# Patient Record
Sex: Male | Born: 1945 | Race: White | Hispanic: No | Marital: Married | Smoking: Never smoker
Health system: Southern US, Community
[De-identification: ages and names within clinical notes are randomized; demographics above are authoritative.]

## PROBLEM LIST (undated history)

## (undated) DIAGNOSIS — Z9289 Personal history of other medical treatment: Secondary | ICD-10-CM

## (undated) DIAGNOSIS — Q231 Congenital insufficiency of aortic valve: Secondary | ICD-10-CM

## (undated) DIAGNOSIS — IMO0002 Reserved for concepts with insufficient information to code with codable children: Secondary | ICD-10-CM

## (undated) DIAGNOSIS — T4145XA Adverse effect of unspecified anesthetic, initial encounter: Secondary | ICD-10-CM

## (undated) DIAGNOSIS — Z952 Presence of prosthetic heart valve: Secondary | ICD-10-CM

## (undated) DIAGNOSIS — T8859XA Other complications of anesthesia, initial encounter: Secondary | ICD-10-CM

## (undated) DIAGNOSIS — I2510 Atherosclerotic heart disease of native coronary artery without angina pectoris: Secondary | ICD-10-CM

## (undated) DIAGNOSIS — I10 Essential (primary) hypertension: Secondary | ICD-10-CM

## (undated) DIAGNOSIS — Z7901 Long term (current) use of anticoagulants: Secondary | ICD-10-CM

## (undated) DIAGNOSIS — C61 Malignant neoplasm of prostate: Secondary | ICD-10-CM

## (undated) DIAGNOSIS — I480 Paroxysmal atrial fibrillation: Secondary | ICD-10-CM

## (undated) DIAGNOSIS — Q2381 Bicuspid aortic valve: Secondary | ICD-10-CM

## (undated) DIAGNOSIS — IMO0001 Reserved for inherently not codable concepts without codable children: Secondary | ICD-10-CM

## (undated) DIAGNOSIS — D689 Coagulation defect, unspecified: Secondary | ICD-10-CM

## (undated) DIAGNOSIS — I499 Cardiac arrhythmia, unspecified: Secondary | ICD-10-CM

## (undated) DIAGNOSIS — I82409 Acute embolism and thrombosis of unspecified deep veins of unspecified lower extremity: Secondary | ICD-10-CM

## (undated) DIAGNOSIS — Z87442 Personal history of urinary calculi: Secondary | ICD-10-CM

## (undated) HISTORY — DX: Reserved for concepts with insufficient information to code with codable children: IMO0002

## (undated) HISTORY — DX: Essential (primary) hypertension: I10

## (undated) HISTORY — DX: Acute embolism and thrombosis of unspecified deep veins of unspecified lower extremity: I82.409

## (undated) HISTORY — PX: BACK SURGERY: SHX140

## (undated) HISTORY — DX: Paroxysmal atrial fibrillation: I48.0

## (undated) HISTORY — DX: Reserved for inherently not codable concepts without codable children: IMO0001

## (undated) HISTORY — DX: Personal history of other medical treatment: Z92.89

## (undated) HISTORY — DX: Congenital insufficiency of aortic valve: Q23.1

## (undated) HISTORY — DX: Coagulation defect, unspecified: Z79.01

## (undated) HISTORY — DX: Atherosclerotic heart disease of native coronary artery without angina pectoris: I25.10

## (undated) HISTORY — DX: Long term (current) use of anticoagulants: D68.9

## (undated) HISTORY — DX: Bicuspid aortic valve: Q23.81

---

## 2002-04-05 ENCOUNTER — Encounter: Payer: Self-pay | Admitting: Emergency Medicine

## 2002-04-05 ENCOUNTER — Inpatient Hospital Stay (HOSPITAL_COMMUNITY): Admission: EM | Admit: 2002-04-05 | Discharge: 2002-04-09 | Payer: Self-pay | Admitting: Emergency Medicine

## 2002-04-06 ENCOUNTER — Encounter: Payer: Self-pay | Admitting: Cardiology

## 2002-04-06 ENCOUNTER — Encounter: Payer: Self-pay | Admitting: Internal Medicine

## 2002-04-07 ENCOUNTER — Encounter: Payer: Self-pay | Admitting: Internal Medicine

## 2002-04-13 ENCOUNTER — Emergency Department (HOSPITAL_COMMUNITY): Admission: EM | Admit: 2002-04-13 | Discharge: 2002-04-13 | Payer: Self-pay | Admitting: Emergency Medicine

## 2002-05-01 ENCOUNTER — Ambulatory Visit (HOSPITAL_COMMUNITY): Admission: RE | Admit: 2002-05-01 | Discharge: 2002-05-01 | Payer: Self-pay | Admitting: *Deleted

## 2002-05-01 ENCOUNTER — Encounter: Payer: Self-pay | Admitting: *Deleted

## 2006-12-16 ENCOUNTER — Inpatient Hospital Stay (HOSPITAL_COMMUNITY): Admission: RE | Admit: 2006-12-16 | Discharge: 2006-12-18 | Payer: Self-pay | Admitting: Neurosurgery

## 2007-03-16 ENCOUNTER — Ambulatory Visit (HOSPITAL_COMMUNITY): Admission: RE | Admit: 2007-03-16 | Discharge: 2007-03-16 | Payer: Self-pay | Admitting: Neurosurgery

## 2008-08-19 ENCOUNTER — Encounter: Admission: RE | Admit: 2008-08-19 | Discharge: 2008-08-19 | Payer: Self-pay | Admitting: Neurosurgery

## 2008-08-23 ENCOUNTER — Encounter: Admission: RE | Admit: 2008-08-23 | Discharge: 2008-08-23 | Payer: Self-pay | Admitting: Neurosurgery

## 2008-09-10 ENCOUNTER — Ambulatory Visit: Payer: Self-pay | Admitting: Emergency Medicine

## 2008-09-10 ENCOUNTER — Inpatient Hospital Stay (HOSPITAL_COMMUNITY): Admission: RE | Admit: 2008-09-10 | Discharge: 2008-09-26 | Payer: Self-pay | Admitting: Neurosurgery

## 2008-09-18 ENCOUNTER — Encounter: Payer: Self-pay | Admitting: Emergency Medicine

## 2008-09-18 ENCOUNTER — Ambulatory Visit: Payer: Self-pay | Admitting: Vascular Surgery

## 2008-09-23 ENCOUNTER — Encounter (INDEPENDENT_AMBULATORY_CARE_PROVIDER_SITE_OTHER): Payer: Self-pay | Admitting: Neurosurgery

## 2008-09-25 ENCOUNTER — Encounter (INDEPENDENT_AMBULATORY_CARE_PROVIDER_SITE_OTHER): Payer: Self-pay | Admitting: Internal Medicine

## 2008-09-25 ENCOUNTER — Ambulatory Visit: Payer: Self-pay | Admitting: Physical Medicine & Rehabilitation

## 2008-09-26 ENCOUNTER — Inpatient Hospital Stay (HOSPITAL_COMMUNITY)
Admission: RE | Admit: 2008-09-26 | Discharge: 2008-10-01 | Payer: Self-pay | Admitting: Physical Medicine & Rehabilitation

## 2010-04-06 IMAGING — RF DG THORACOLUMBAR SPINE 2V
1 series · 5 of 5 positions shown · non-contrast
Comparison: None available.

CLINICAL DATA: T10-S1 decompression.

THORACOLUMBAR SPINE - 2 VIEW

[Series 1: run · 5 of 5 slices shown]
[im 1/5]
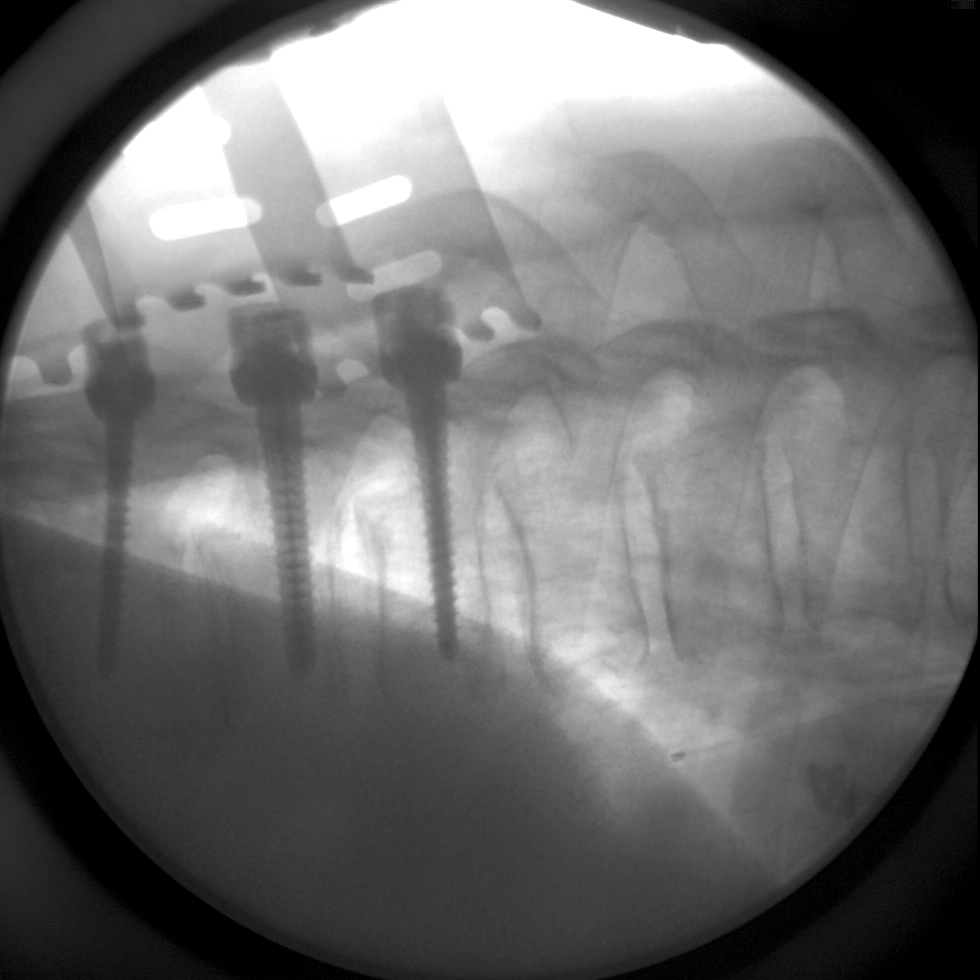
[im 2/5]
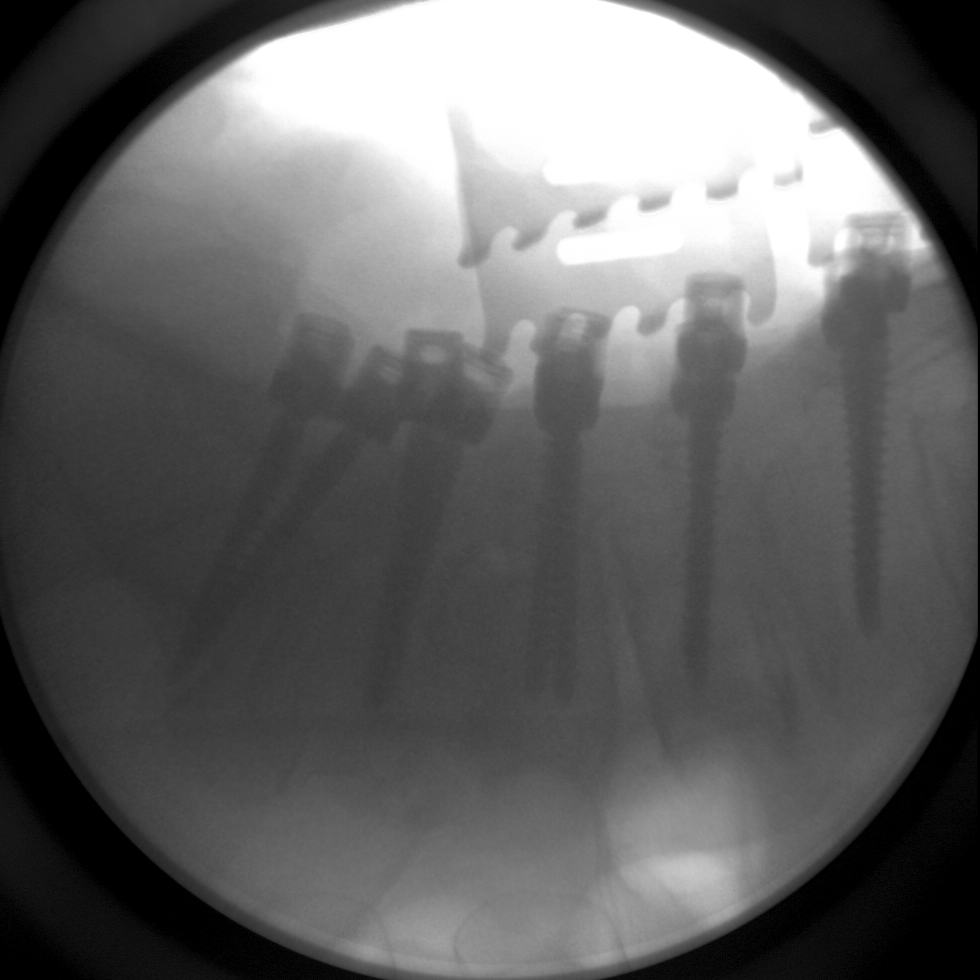
[im 3/5]
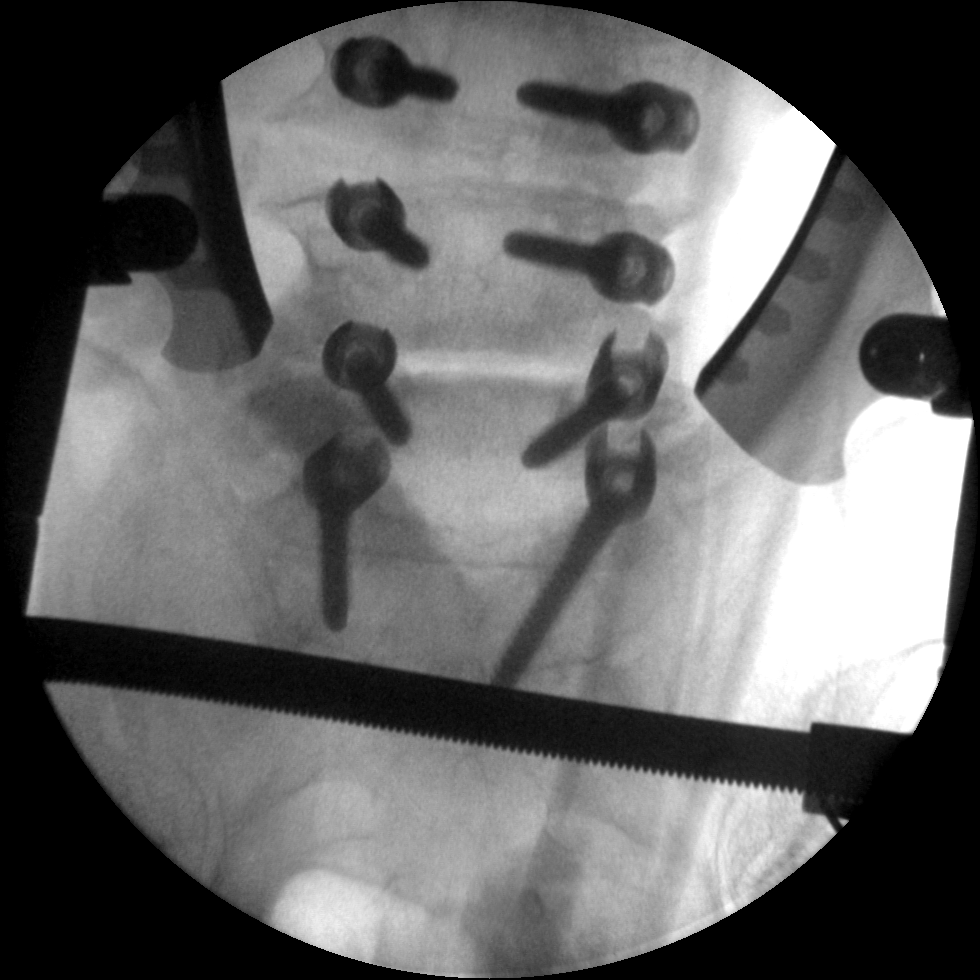
[im 4/5]
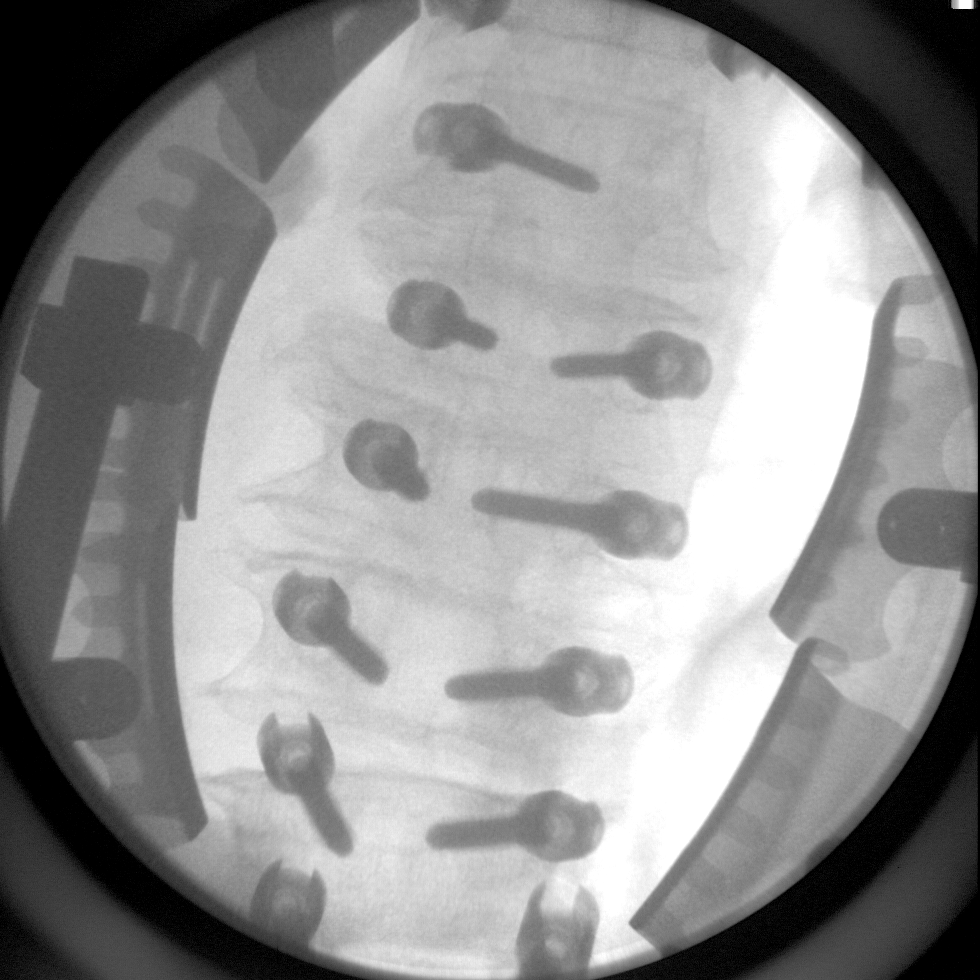
[im 5/5]
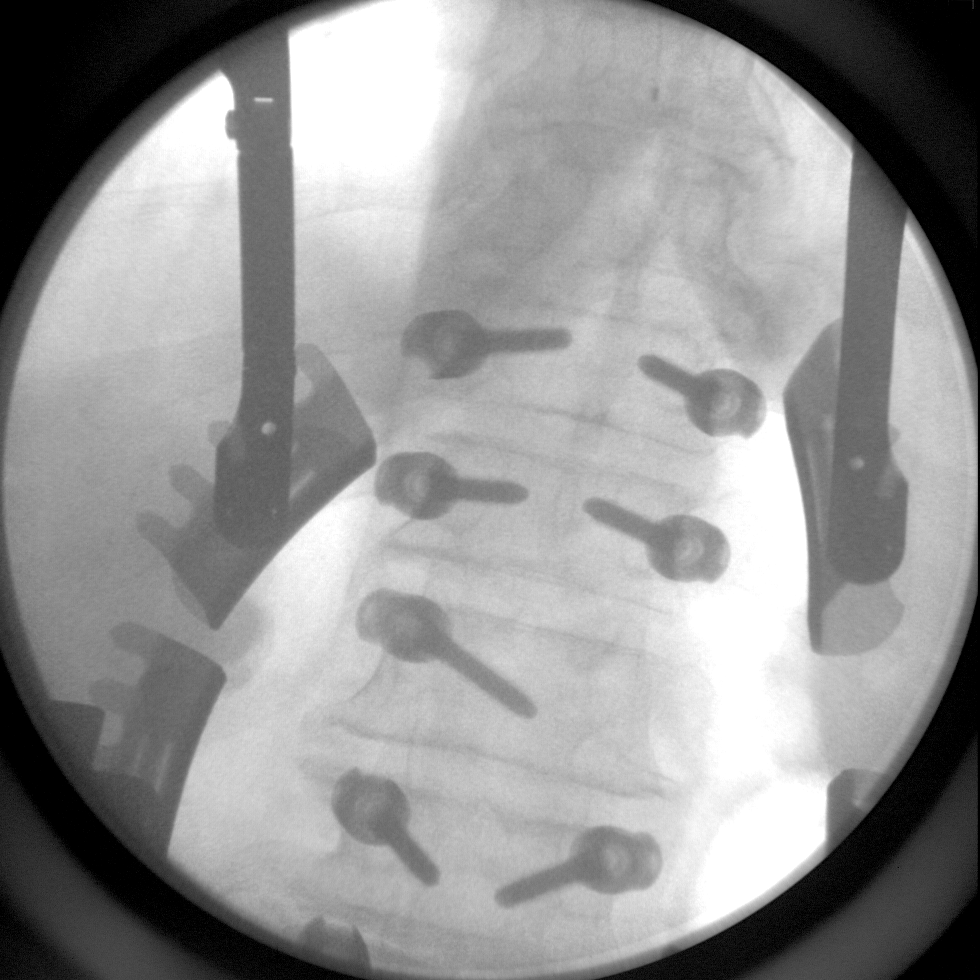

[5 of 5 positions shown; findings below may reference images not displayed]

FINDINGS: We are provided with five fluoroscopic intraoperative
spot views of the lower lumbar spine.  Images demonstrate placement
of pedicle screws from T10-S1.  Note is made that only a screw on
the left side is identified at T12.
IMPRESSION: T10-S1 PLIF in progress.

## 2010-04-08 IMAGING — CR DG ABD PORTABLE 1V
1 series · 1 of 1 positions shown · non-contrast
Comparison: Intraoperative film 09/10/2008

CLINICAL DATA: Recent lumbar surgery.

ABDOMEN - 1 VIEW

[view not recorded]
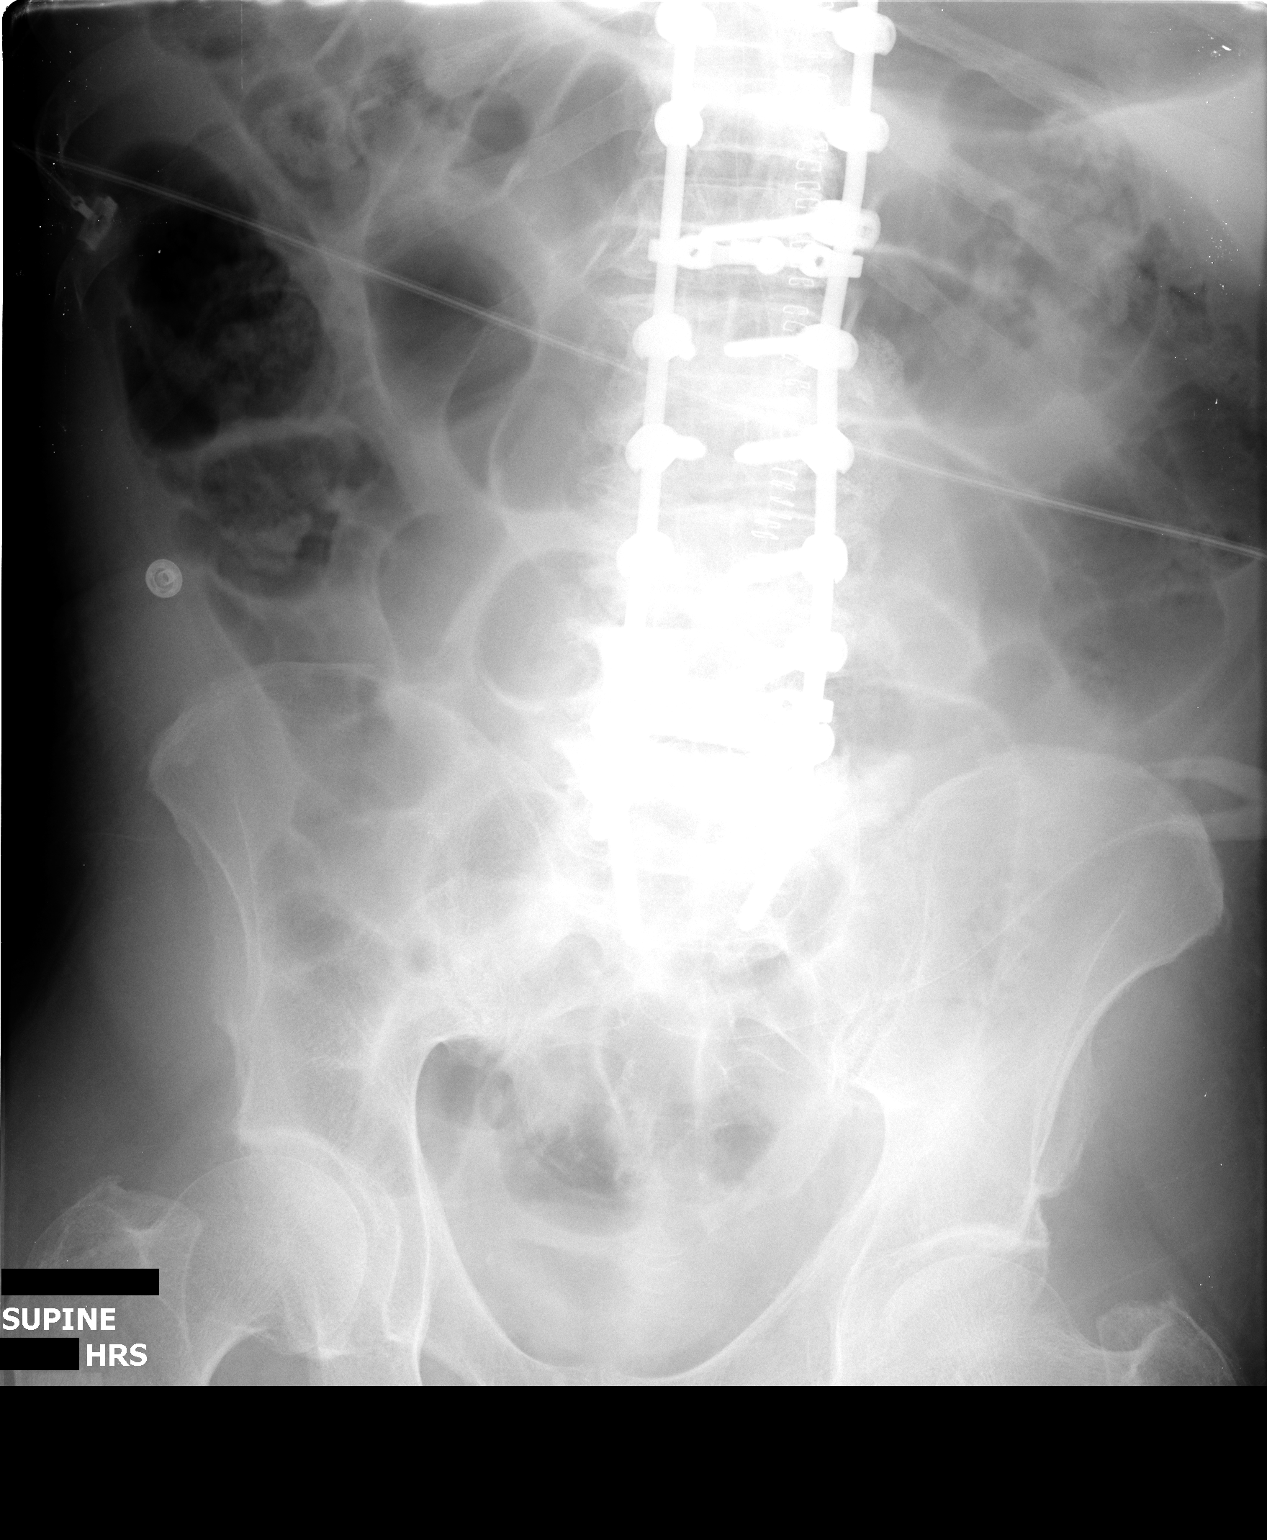

[1 of 1 positions shown; findings below may reference images not displayed]

FINDINGS: There is posterior fusion noted with pedicle screws and
posterior fusion rods and graft material  There is gas filled loops
of large and small bowel without evidence of bowel dilatation.
There is gas in the rectosigmoid colon.
IMPRESSION: 1.  Gas filled bowel without evidence of obstruction.
2.  Posterior lumbar fusion.

## 2010-04-11 IMAGING — CR DG CHEST 1V PORT
1 series · 1 of 1 positions shown · non-contrast
Comparison: 09/04/2008.

CLINICAL DATA: Ileus.  Respiratory distress.  Lumbar stenosis.

PORTABLE CHEST - 1 VIEW

[view not recorded]
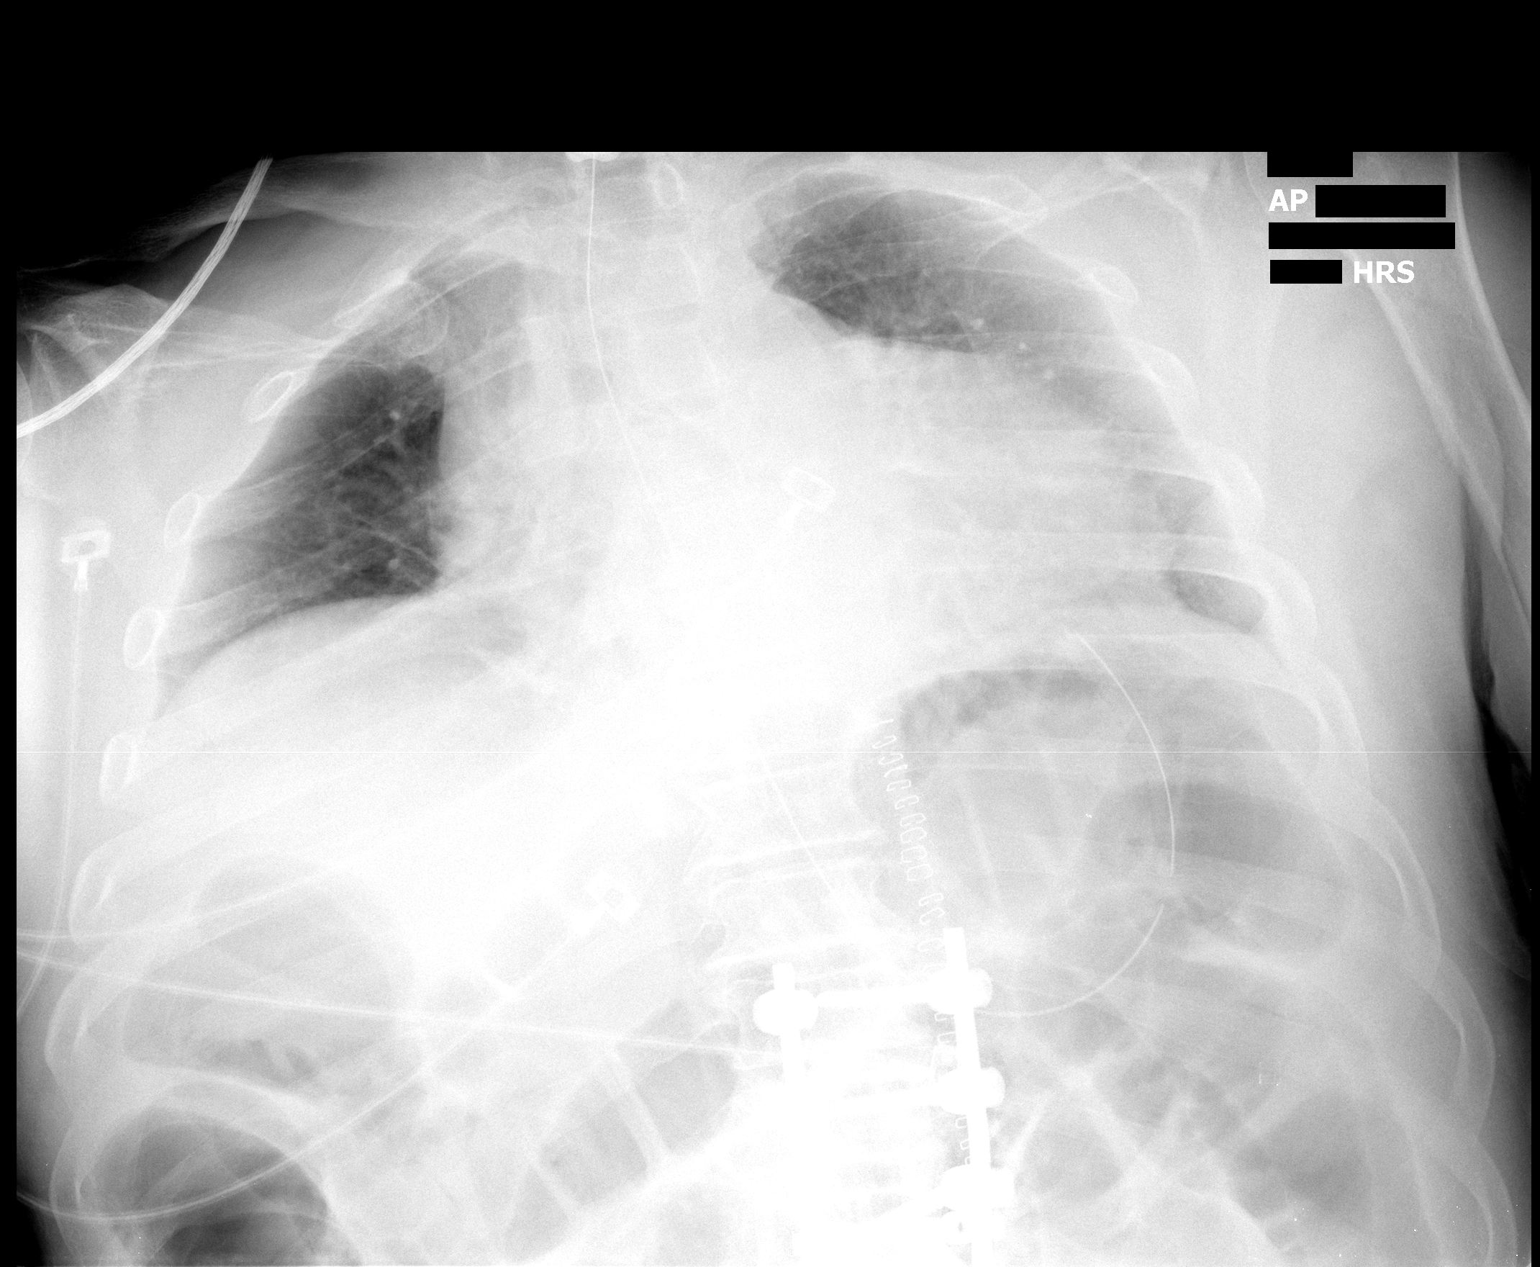

[1 of 1 positions shown; findings below may reference images not displayed]

FINDINGS: This exam of shows a markedly apical lordotic projection.
Nasogastric tube is present within the stomach.  Gaseous distention
of large and small bowel is present.  Basilar atelectasis is
present.  Lung volumes are low.  No airspace disease. Cervical ACDF
and lumbar posterior lumbar interbody fusion.
IMPRESSION: 1.  Low volume chest.
2.  Gaseous distention of bowel.
3.  Nasogastric tube within stomach.

## 2010-04-14 IMAGING — CR DG ABD PORTABLE 1V
1 series · 1 of 1 positions shown · non-contrast
Comparison: 09/17/2008

CLINICAL DATA: Ileus

ABDOMEN - 1 VIEW

[view not recorded]
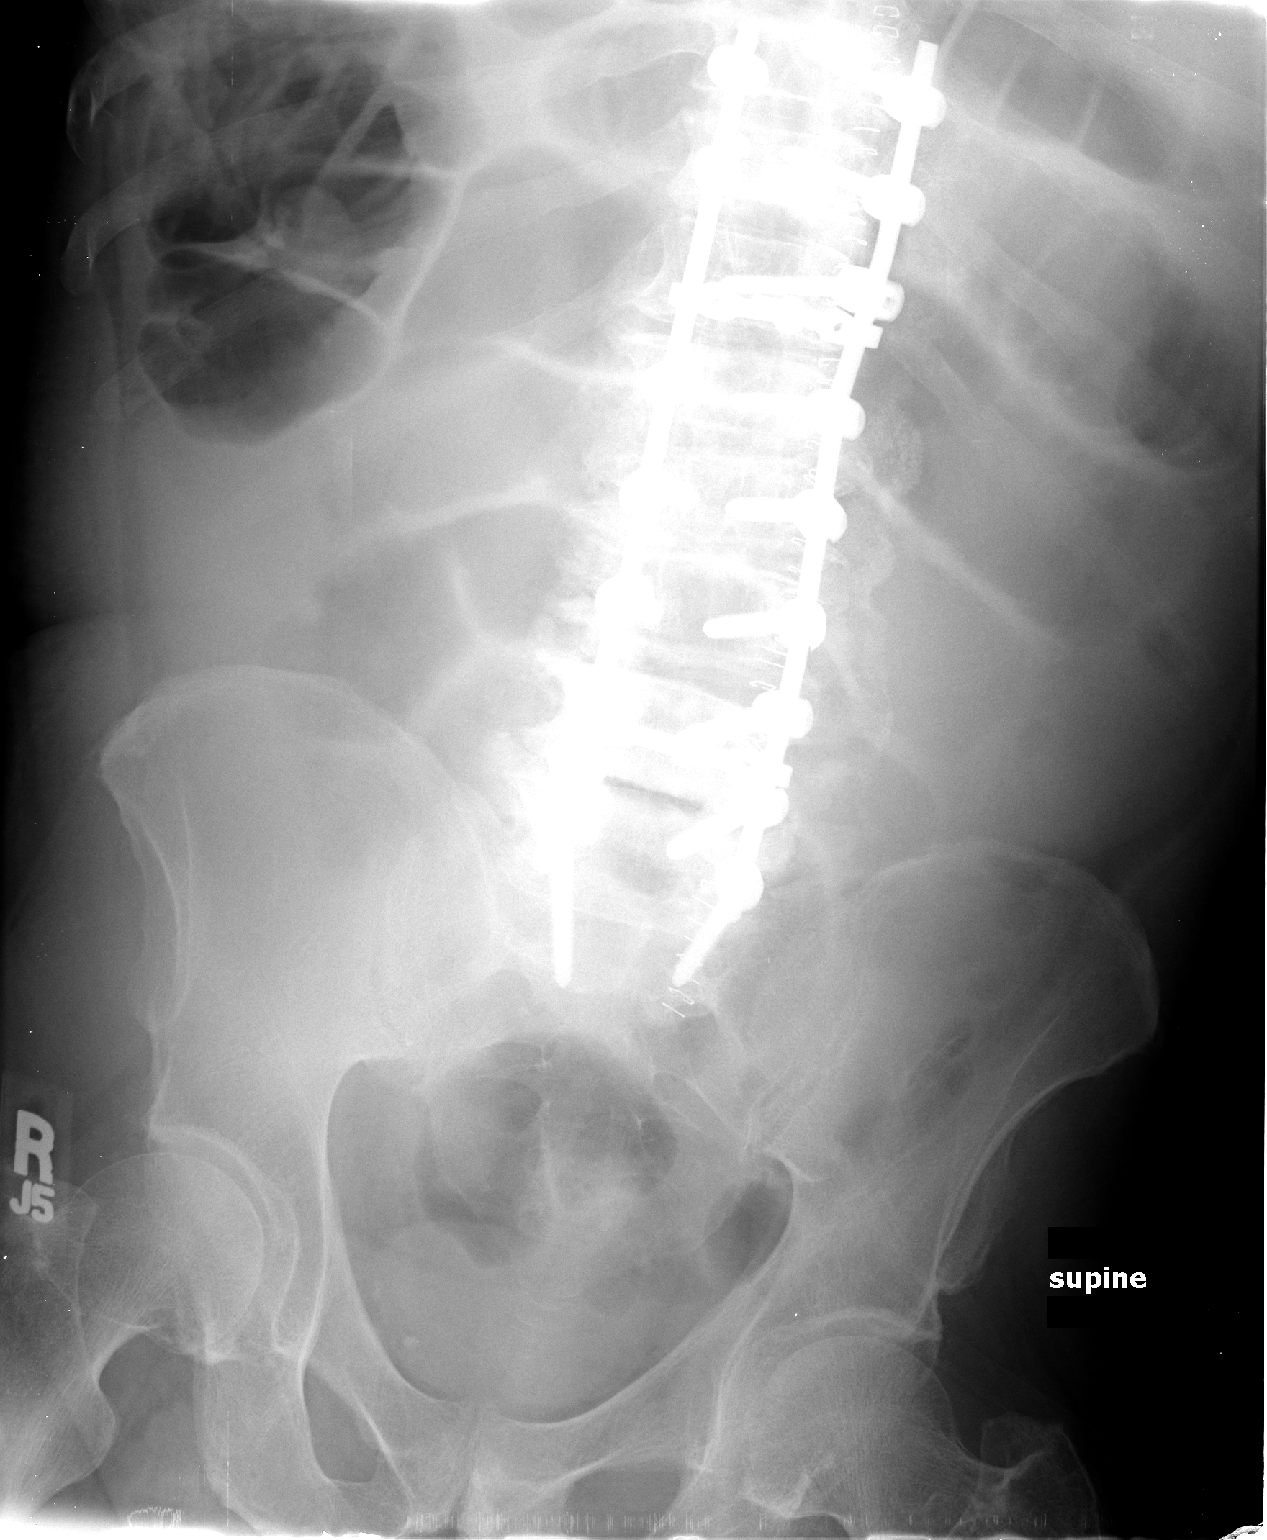

[1 of 1 positions shown; findings below may reference images not displayed]

FINDINGS: Distended small and large bowel loops persist but are
improved compatible with improving but persistent ileus.  No
obvious free intraperitoneal gas.  Spinal stabilization hardware is
unchanged.
IMPRESSION: Improving ileus.

## 2010-04-14 IMAGING — CR DG CHEST 1V PORT
1 series · 1 of 1 positions shown · non-contrast
Comparison: 09/15/2008

CLINICAL DATA: Lumbar stenosis.  Respiratory distress.

PORTABLE CHEST - 1 VIEW

[view not recorded]
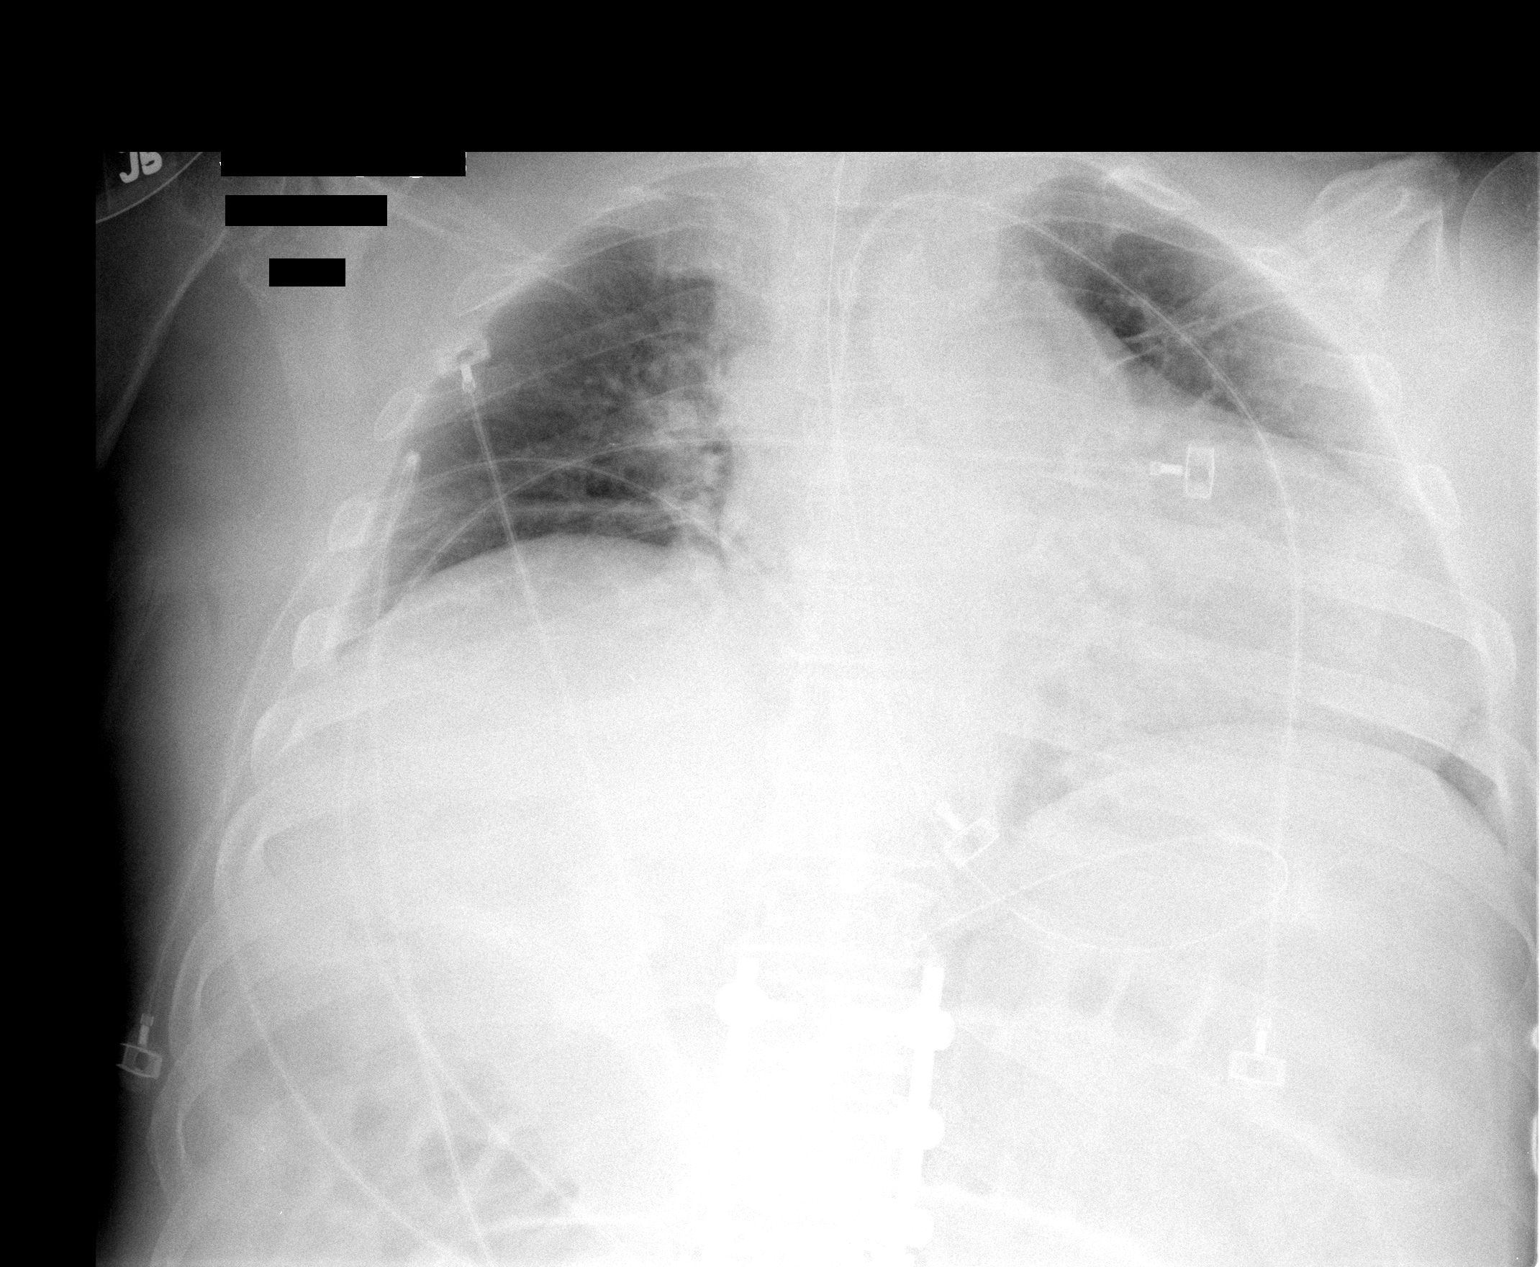

[1 of 1 positions shown; findings below may reference images not displayed]

FINDINGS: Prior cervical spine fixation.  Upper lumbar spine
fixation. Mildly degraded exam due to AP portable technique and
patient body habitus.  apical lordotic positioning. Midline
trachea. Cardiomegaly accentuated by AP portable technique.  No
definite pleural effusion. No pneumothorax.  Extremely low lung
volumes.  No definite airspace disease.  Moderate right
hemidiaphragm elevation.  Improved persistent gaseous distention of
bowel.
IMPRESSION: 1. Decreased sensitivity and specificity exam due to technique
related factors, as described above.
2.  Cardiomegaly and extremely low lung volumes.
3.  Improved bowel distention, incompletely imaged.

## 2010-08-18 LAB — BLOOD GAS, ARTERIAL
Acid-Base Excess: 4.7 mmol/L — ABNORMAL HIGH (ref 0.0–2.0)
Bicarbonate: 28.6 mEq/L — ABNORMAL HIGH (ref 20.0–24.0)
Drawn by: 249101
O2 Content: 3 L/min
O2 Saturation: 97.8 %
Patient temperature: 99.4
TCO2: 29.9 mmol/L (ref 0–100)
pCO2 arterial: 42.4 mmHg (ref 35.0–45.0)
pH, Arterial: 7.446 (ref 7.350–7.450)
pO2, Arterial: 91.8 mmHg (ref 80.0–100.0)

## 2010-08-18 LAB — COMPREHENSIVE METABOLIC PANEL
ALT: 27 U/L (ref 0–53)
ALT: 33 U/L (ref 0–53)
ALT: 33 U/L (ref 0–53)
AST: 18 U/L (ref 0–37)
AST: 18 U/L (ref 0–37)
AST: 26 U/L (ref 0–37)
Albumin: 2.1 g/dL — ABNORMAL LOW (ref 3.5–5.2)
Albumin: 2.1 g/dL — ABNORMAL LOW (ref 3.5–5.2)
Albumin: 2.5 g/dL — ABNORMAL LOW (ref 3.5–5.2)
Alkaline Phosphatase: 106 U/L (ref 39–117)
Alkaline Phosphatase: 73 U/L (ref 39–117)
Alkaline Phosphatase: 79 U/L (ref 39–117)
BUN: 6 mg/dL (ref 6–23)
BUN: 6 mg/dL (ref 6–23)
BUN: 6 mg/dL (ref 6–23)
CO2: 28 mEq/L (ref 19–32)
CO2: 28 mEq/L (ref 19–32)
CO2: 31 mEq/L (ref 19–32)
Calcium: 8.6 mg/dL (ref 8.4–10.5)
Calcium: 8.7 mg/dL (ref 8.4–10.5)
Calcium: 8.9 mg/dL (ref 8.4–10.5)
Chloride: 101 mEq/L (ref 96–112)
Chloride: 103 mEq/L (ref 96–112)
Chloride: 106 mEq/L (ref 96–112)
Creatinine, Ser: 0.74 mg/dL (ref 0.4–1.5)
Creatinine, Ser: 0.77 mg/dL (ref 0.4–1.5)
Creatinine, Ser: 0.79 mg/dL (ref 0.4–1.5)
GFR calc Af Amer: >60 mL/min (ref >60)
GFR calc Af Amer: >60 mL/min (ref >60)
GFR calc Af Amer: >60 mL/min (ref >60)
GFR calc non Af Amer: >60 mL/min (ref >60)
GFR calc non Af Amer: >60 mL/min (ref >60)
GFR calc non Af Amer: >60 mL/min (ref >60)
Glucose, Bld: 111 mg/dL — ABNORMAL HIGH (ref 70–99)
Glucose, Bld: 113 mg/dL — ABNORMAL HIGH (ref 70–99)
Glucose, Bld: 118 mg/dL — ABNORMAL HIGH (ref 70–99)
Potassium: 3.4 mEq/L — ABNORMAL LOW (ref 3.5–5.1)
Potassium: 3.5 mEq/L (ref 3.5–5.1)
Potassium: 3.5 mEq/L (ref 3.5–5.1)
Sodium: 139 mEq/L (ref 135–145)
Sodium: 139 mEq/L (ref 135–145)
Sodium: 141 mEq/L (ref 135–145)
Total Bilirubin: 0.9 mg/dL (ref 0.3–1.2)
Total Bilirubin: 1 mg/dL (ref 0.3–1.2)
Total Bilirubin: 1.1 mg/dL (ref 0.3–1.2)
Total Protein: 4.9 g/dL — ABNORMAL LOW (ref 6.0–8.3)
Total Protein: 5 g/dL — ABNORMAL LOW (ref 6.0–8.3)
Total Protein: 5.6 g/dL — ABNORMAL LOW (ref 6.0–8.3)

## 2010-08-18 LAB — GLUCOSE, CAPILLARY
Glucose-Capillary: 100 mg/dL — ABNORMAL HIGH (ref 70–99)
Glucose-Capillary: 103 mg/dL — ABNORMAL HIGH (ref 70–99)
Glucose-Capillary: 104 mg/dL — ABNORMAL HIGH (ref 70–99)
Glucose-Capillary: 108 mg/dL — ABNORMAL HIGH (ref 70–99)
Glucose-Capillary: 110 mg/dL — ABNORMAL HIGH (ref 70–99)
Glucose-Capillary: 110 mg/dL — ABNORMAL HIGH (ref 70–99)
Glucose-Capillary: 110 mg/dL — ABNORMAL HIGH (ref 70–99)
Glucose-Capillary: 110 mg/dL — ABNORMAL HIGH (ref 70–99)
Glucose-Capillary: 112 mg/dL — ABNORMAL HIGH (ref 70–99)
Glucose-Capillary: 113 mg/dL — ABNORMAL HIGH (ref 70–99)
Glucose-Capillary: 113 mg/dL — ABNORMAL HIGH (ref 70–99)
Glucose-Capillary: 113 mg/dL — ABNORMAL HIGH (ref 70–99)
Glucose-Capillary: 114 mg/dL — ABNORMAL HIGH (ref 70–99)
Glucose-Capillary: 114 mg/dL — ABNORMAL HIGH (ref 70–99)
Glucose-Capillary: 115 mg/dL — ABNORMAL HIGH (ref 70–99)
Glucose-Capillary: 115 mg/dL — ABNORMAL HIGH (ref 70–99)
Glucose-Capillary: 116 mg/dL — ABNORMAL HIGH (ref 70–99)
Glucose-Capillary: 116 mg/dL — ABNORMAL HIGH (ref 70–99)
Glucose-Capillary: 117 mg/dL — ABNORMAL HIGH (ref 70–99)
Glucose-Capillary: 119 mg/dL — ABNORMAL HIGH (ref 70–99)
Glucose-Capillary: 119 mg/dL — ABNORMAL HIGH (ref 70–99)
Glucose-Capillary: 119 mg/dL — ABNORMAL HIGH (ref 70–99)
Glucose-Capillary: 120 mg/dL — ABNORMAL HIGH (ref 70–99)
Glucose-Capillary: 123 mg/dL — ABNORMAL HIGH (ref 70–99)
Glucose-Capillary: 124 mg/dL — ABNORMAL HIGH (ref 70–99)
Glucose-Capillary: 124 mg/dL — ABNORMAL HIGH (ref 70–99)
Glucose-Capillary: 125 mg/dL — ABNORMAL HIGH (ref 70–99)
Glucose-Capillary: 125 mg/dL — ABNORMAL HIGH (ref 70–99)
Glucose-Capillary: 126 mg/dL — ABNORMAL HIGH (ref 70–99)
Glucose-Capillary: 131 mg/dL — ABNORMAL HIGH (ref 70–99)
Glucose-Capillary: 131 mg/dL — ABNORMAL HIGH (ref 70–99)
Glucose-Capillary: 132 mg/dL — ABNORMAL HIGH (ref 70–99)
Glucose-Capillary: 134 mg/dL — ABNORMAL HIGH (ref 70–99)
Glucose-Capillary: 137 mg/dL — ABNORMAL HIGH (ref 70–99)
Glucose-Capillary: 140 mg/dL — ABNORMAL HIGH (ref 70–99)
Glucose-Capillary: 141 mg/dL — ABNORMAL HIGH (ref 70–99)
Glucose-Capillary: 144 mg/dL — ABNORMAL HIGH (ref 70–99)
Glucose-Capillary: 144 mg/dL — ABNORMAL HIGH (ref 70–99)
Glucose-Capillary: 149 mg/dL — ABNORMAL HIGH (ref 70–99)
Glucose-Capillary: 152 mg/dL — ABNORMAL HIGH (ref 70–99)
Glucose-Capillary: 153 mg/dL — ABNORMAL HIGH (ref 70–99)
Glucose-Capillary: 72 mg/dL (ref 70–99)
Glucose-Capillary: 93 mg/dL (ref 70–99)
Glucose-Capillary: 95 mg/dL (ref 70–99)
Glucose-Capillary: 99 mg/dL (ref 70–99)
Glucose-Capillary: 103 mg/dL — ABNORMAL HIGH (ref 70–99)
Glucose-Capillary: 106 mg/dL — ABNORMAL HIGH (ref 70–99)
Glucose-Capillary: 110 mg/dL — ABNORMAL HIGH (ref 70–99)
Glucose-Capillary: 111 mg/dL — ABNORMAL HIGH (ref 70–99)
Glucose-Capillary: 112 mg/dL — ABNORMAL HIGH (ref 70–99)
Glucose-Capillary: 112 mg/dL — ABNORMAL HIGH (ref 70–99)
Glucose-Capillary: 113 mg/dL — ABNORMAL HIGH (ref 70–99)
Glucose-Capillary: 114 mg/dL — ABNORMAL HIGH (ref 70–99)
Glucose-Capillary: 114 mg/dL — ABNORMAL HIGH (ref 70–99)
Glucose-Capillary: 117 mg/dL — ABNORMAL HIGH (ref 70–99)
Glucose-Capillary: 117 mg/dL — ABNORMAL HIGH (ref 70–99)
Glucose-Capillary: 117 mg/dL — ABNORMAL HIGH (ref 70–99)
Glucose-Capillary: 118 mg/dL — ABNORMAL HIGH (ref 70–99)
Glucose-Capillary: 119 mg/dL — ABNORMAL HIGH (ref 70–99)
Glucose-Capillary: 120 mg/dL — ABNORMAL HIGH (ref 70–99)
Glucose-Capillary: 120 mg/dL — ABNORMAL HIGH (ref 70–99)
Glucose-Capillary: 121 mg/dL — ABNORMAL HIGH (ref 70–99)
Glucose-Capillary: 122 mg/dL — ABNORMAL HIGH (ref 70–99)
Glucose-Capillary: 123 mg/dL — ABNORMAL HIGH (ref 70–99)
Glucose-Capillary: 123 mg/dL — ABNORMAL HIGH (ref 70–99)
Glucose-Capillary: 123 mg/dL — ABNORMAL HIGH (ref 70–99)
Glucose-Capillary: 123 mg/dL — ABNORMAL HIGH (ref 70–99)
Glucose-Capillary: 128 mg/dL — ABNORMAL HIGH (ref 70–99)
Glucose-Capillary: 128 mg/dL — ABNORMAL HIGH (ref 70–99)
Glucose-Capillary: 130 mg/dL — ABNORMAL HIGH (ref 70–99)
Glucose-Capillary: 132 mg/dL — ABNORMAL HIGH (ref 70–99)
Glucose-Capillary: 133 mg/dL — ABNORMAL HIGH (ref 70–99)
Glucose-Capillary: 136 mg/dL — ABNORMAL HIGH (ref 70–99)
Glucose-Capillary: 136 mg/dL — ABNORMAL HIGH (ref 70–99)
Glucose-Capillary: 138 mg/dL — ABNORMAL HIGH (ref 70–99)
Glucose-Capillary: 139 mg/dL — ABNORMAL HIGH (ref 70–99)
Glucose-Capillary: 139 mg/dL — ABNORMAL HIGH (ref 70–99)
Glucose-Capillary: 144 mg/dL — ABNORMAL HIGH (ref 70–99)
Glucose-Capillary: 145 mg/dL — ABNORMAL HIGH (ref 70–99)
Glucose-Capillary: 145 mg/dL — ABNORMAL HIGH (ref 70–99)
Glucose-Capillary: 145 mg/dL — ABNORMAL HIGH (ref 70–99)
Glucose-Capillary: 145 mg/dL — ABNORMAL HIGH (ref 70–99)
Glucose-Capillary: 145 mg/dL — ABNORMAL HIGH (ref 70–99)
Glucose-Capillary: 146 mg/dL — ABNORMAL HIGH (ref 70–99)
Glucose-Capillary: 146 mg/dL — ABNORMAL HIGH (ref 70–99)
Glucose-Capillary: 147 mg/dL — ABNORMAL HIGH (ref 70–99)
Glucose-Capillary: 148 mg/dL — ABNORMAL HIGH (ref 70–99)
Glucose-Capillary: 148 mg/dL — ABNORMAL HIGH (ref 70–99)
Glucose-Capillary: 148 mg/dL — ABNORMAL HIGH (ref 70–99)
Glucose-Capillary: 149 mg/dL — ABNORMAL HIGH (ref 70–99)
Glucose-Capillary: 149 mg/dL — ABNORMAL HIGH (ref 70–99)
Glucose-Capillary: 150 mg/dL — ABNORMAL HIGH (ref 70–99)
Glucose-Capillary: 150 mg/dL — ABNORMAL HIGH (ref 70–99)
Glucose-Capillary: 151 mg/dL — ABNORMAL HIGH (ref 70–99)
Glucose-Capillary: 152 mg/dL — ABNORMAL HIGH (ref 70–99)
Glucose-Capillary: 153 mg/dL — ABNORMAL HIGH (ref 70–99)
Glucose-Capillary: 155 mg/dL — ABNORMAL HIGH (ref 70–99)
Glucose-Capillary: 159 mg/dL — ABNORMAL HIGH (ref 70–99)
Glucose-Capillary: 168 mg/dL — ABNORMAL HIGH (ref 70–99)
Glucose-Capillary: 96 mg/dL (ref 70–99)
Glucose-Capillary: 97 mg/dL (ref 70–99)

## 2010-08-18 LAB — CBC
HCT: 29.3 % — ABNORMAL LOW (ref 39.0–52.0)
HCT: 30 % — ABNORMAL LOW (ref 39.0–52.0)
HCT: 30.1 % — ABNORMAL LOW (ref 39.0–52.0)
HCT: 30.5 % — ABNORMAL LOW (ref 39.0–52.0)
HCT: 30.7 % — ABNORMAL LOW (ref 39.0–52.0)
HCT: 31.3 % — ABNORMAL LOW (ref 39.0–52.0)
HCT: 32.1 % — ABNORMAL LOW (ref 39.0–52.0)
HCT: 33.1 % — ABNORMAL LOW (ref 39.0–52.0)
HCT: 23.4 % — ABNORMAL LOW (ref 39.0–52.0)
HCT: 23.8 % — ABNORMAL LOW (ref 39.0–52.0)
HCT: 24.1 % — ABNORMAL LOW (ref 39.0–52.0)
HCT: 24.5 % — ABNORMAL LOW (ref 39.0–52.0)
HCT: 24.5 % — ABNORMAL LOW (ref 39.0–52.0)
HCT: 25.4 % — ABNORMAL LOW (ref 39.0–52.0)
HCT: 28.3 % — ABNORMAL LOW (ref 39.0–52.0)
HCT: 29.2 % — ABNORMAL LOW (ref 39.0–52.0)
HCT: 35.2 % — ABNORMAL LOW (ref 39.0–52.0)
HCT: 35.3 % — ABNORMAL LOW (ref 39.0–52.0)
HCT: 40.3 % (ref 39.0–52.0)
Hemoglobin: 10.2 g/dL — ABNORMAL LOW (ref 13.0–17.0)
Hemoglobin: 10.3 g/dL — ABNORMAL LOW (ref 13.0–17.0)
Hemoglobin: 10.4 g/dL — ABNORMAL LOW (ref 13.0–17.0)
Hemoglobin: 10.5 g/dL — ABNORMAL LOW (ref 13.0–17.0)
Hemoglobin: 10.5 g/dL — ABNORMAL LOW (ref 13.0–17.0)
Hemoglobin: 11.1 g/dL — ABNORMAL LOW (ref 13.0–17.0)
Hemoglobin: 11.3 g/dL — ABNORMAL LOW (ref 13.0–17.0)
Hemoglobin: 9.9 g/dL — ABNORMAL LOW (ref 13.0–17.0)
Hemoglobin: 10 g/dL — ABNORMAL LOW (ref 13.0–17.0)
Hemoglobin: 10 g/dL — ABNORMAL LOW (ref 13.0–17.0)
Hemoglobin: 12.3 g/dL — ABNORMAL LOW (ref 13.0–17.0)
Hemoglobin: 12.4 g/dL — ABNORMAL LOW (ref 13.0–17.0)
Hemoglobin: 14.1 g/dL (ref 13.0–17.0)
Hemoglobin: 8.1 g/dL — ABNORMAL LOW (ref 13.0–17.0)
Hemoglobin: 8.2 g/dL — ABNORMAL LOW (ref 13.0–17.0)
Hemoglobin: 8.4 g/dL — ABNORMAL LOW (ref 13.0–17.0)
Hemoglobin: 8.5 g/dL — ABNORMAL LOW (ref 13.0–17.0)
Hemoglobin: 8.6 g/dL — ABNORMAL LOW (ref 13.0–17.0)
Hemoglobin: 8.9 g/dL — ABNORMAL LOW (ref 13.0–17.0)
MCHC: 33.5 g/dL (ref 30.0–36.0)
MCHC: 33.9 g/dL (ref 30.0–36.0)
MCHC: 34 g/dL (ref 30.0–36.0)
MCHC: 34 g/dL (ref 30.0–36.0)
MCHC: 34.1 g/dL (ref 30.0–36.0)
MCHC: 34.2 g/dL (ref 30.0–36.0)
MCHC: 34.4 g/dL (ref 30.0–36.0)
MCHC: 34.5 g/dL (ref 30.0–36.0)
MCHC: 34.1 g/dL (ref 30.0–36.0)
MCHC: 34.3 g/dL (ref 30.0–36.0)
MCHC: 34.5 g/dL (ref 30.0–36.0)
MCHC: 34.7 g/dL (ref 30.0–36.0)
MCHC: 34.8 g/dL (ref 30.0–36.0)
MCHC: 34.9 g/dL (ref 30.0–36.0)
MCHC: 34.9 g/dL (ref 30.0–36.0)
MCHC: 35 g/dL (ref 30.0–36.0)
MCHC: 35.1 g/dL (ref 30.0–36.0)
MCHC: 35.1 g/dL (ref 30.0–36.0)
MCHC: 35.2 g/dL (ref 30.0–36.0)
MCV: 87.3 fL (ref 78.0–100.0)
MCV: 87.4 fL (ref 78.0–100.0)
MCV: 88.7 fL (ref 78.0–100.0)
MCV: 89 fL (ref 78.0–100.0)
MCV: 89.4 fL (ref 78.0–100.0)
MCV: 89.6 fL (ref 78.0–100.0)
MCV: 89.6 fL (ref 78.0–100.0)
MCV: 90 fL (ref 78.0–100.0)
MCV: 89 fL (ref 78.0–100.0)
MCV: 89.1 fL (ref 78.0–100.0)
MCV: 89.3 fL (ref 78.0–100.0)
MCV: 89.3 fL (ref 78.0–100.0)
MCV: 89.7 fL (ref 78.0–100.0)
MCV: 90.4 fL (ref 78.0–100.0)
MCV: 90.8 fL (ref 78.0–100.0)
MCV: 90.9 fL (ref 78.0–100.0)
MCV: 91.1 fL (ref 78.0–100.0)
MCV: 91.2 fL (ref 78.0–100.0)
MCV: 91.4 fL (ref 78.0–100.0)
Platelets: 300 10*3/uL (ref 150–400)
Platelets: 301 10*3/uL (ref 150–400)
Platelets: 305 10*3/uL (ref 150–400)
Platelets: 305 10*3/uL (ref 150–400)
Platelets: 314 10*3/uL (ref 150–400)
Platelets: 317 10*3/uL (ref 150–400)
Platelets: 322 10*3/uL (ref 150–400)
Platelets: 326 10*3/uL (ref 150–400)
Platelets: 105 10*3/uL — ABNORMAL LOW (ref 150–400)
Platelets: 119 K/uL — ABNORMAL LOW (ref 150–400)
Platelets: 131 10*3/uL — ABNORMAL LOW (ref 150–400)
Platelets: 209 10*3/uL (ref 150–400)
Platelets: 254 K/uL (ref 150–400)
Platelets: 263 K/uL (ref 150–400)
Platelets: 270 K/uL (ref 150–400)
Platelets: 329 10*3/uL (ref 150–400)
Platelets: 77 K/uL — ABNORMAL LOW (ref 150–400)
Platelets: 84 10*3/uL — ABNORMAL LOW (ref 150–400)
Platelets: 93 K/uL — ABNORMAL LOW (ref 150–400)
RBC: 3.25 MIL/uL — ABNORMAL LOW (ref 4.22–5.81)
RBC: 3.37 MIL/uL — ABNORMAL LOW (ref 4.22–5.81)
RBC: 3.37 MIL/uL — ABNORMAL LOW (ref 4.22–5.81)
RBC: 3.42 MIL/uL — ABNORMAL LOW (ref 4.22–5.81)
RBC: 3.44 MIL/uL — ABNORMAL LOW (ref 4.22–5.81)
RBC: 3.49 MIL/uL — ABNORMAL LOW (ref 4.22–5.81)
RBC: 3.68 MIL/uL — ABNORMAL LOW (ref 4.22–5.81)
RBC: 3.79 MIL/uL — ABNORMAL LOW (ref 4.22–5.81)
RBC: 2.57 MIL/uL — ABNORMAL LOW (ref 4.22–5.81)
RBC: 2.61 MIL/uL — ABNORMAL LOW (ref 4.22–5.81)
RBC: 2.65 MIL/uL — ABNORMAL LOW (ref 4.22–5.81)
RBC: 2.7 MIL/uL — ABNORMAL LOW (ref 4.22–5.81)
RBC: 2.7 MIL/uL — ABNORMAL LOW (ref 4.22–5.81)
RBC: 2.81 MIL/uL — ABNORMAL LOW (ref 4.22–5.81)
RBC: 3.15 MIL/uL — ABNORMAL LOW (ref 4.22–5.81)
RBC: 3.29 MIL/uL — ABNORMAL LOW (ref 4.22–5.81)
RBC: 3.95 MIL/uL — ABNORMAL LOW (ref 4.22–5.81)
RBC: 3.96 MIL/uL — ABNORMAL LOW (ref 4.22–5.81)
RBC: 4.52 MIL/uL (ref 4.22–5.81)
RDW: 14.3 % (ref 11.5–15.5)
RDW: 14.6 % (ref 11.5–15.5)
RDW: 14.7 % (ref 11.5–15.5)
RDW: 14.8 % (ref 11.5–15.5)
RDW: 14.9 % (ref 11.5–15.5)
RDW: 15 % (ref 11.5–15.5)
RDW: 15.2 % (ref 11.5–15.5)
RDW: 15.2 % (ref 11.5–15.5)
RDW: 13.8 % (ref 11.5–15.5)
RDW: 14.2 % (ref 11.5–15.5)
RDW: 14.3 % (ref 11.5–15.5)
RDW: 14.3 % (ref 11.5–15.5)
RDW: 14.3 % (ref 11.5–15.5)
RDW: 14.3 % (ref 11.5–15.5)
RDW: 14.4 % (ref 11.5–15.5)
RDW: 14.5 % (ref 11.5–15.5)
RDW: 14.7 % (ref 11.5–15.5)
RDW: 15 % (ref 11.5–15.5)
RDW: 15 % (ref 11.5–15.5)
WBC: 6.1 10*3/uL (ref 4.0–10.5)
WBC: 6.2 10*3/uL (ref 4.0–10.5)
WBC: 6.7 10*3/uL (ref 4.0–10.5)
WBC: 7.6 10*3/uL (ref 4.0–10.5)
WBC: 7.7 10*3/uL (ref 4.0–10.5)
WBC: 8.1 10*3/uL (ref 4.0–10.5)
WBC: 8.3 10*3/uL (ref 4.0–10.5)
WBC: 8.5 10*3/uL (ref 4.0–10.5)
WBC: 11.2 K/uL — ABNORMAL HIGH (ref 4.0–10.5)
WBC: 11.3 10*3/uL — ABNORMAL HIGH (ref 4.0–10.5)
WBC: 11.7 10*3/uL — ABNORMAL HIGH (ref 4.0–10.5)
WBC: 12 K/uL — ABNORMAL HIGH (ref 4.0–10.5)
WBC: 12.3 10*3/uL — ABNORMAL HIGH (ref 4.0–10.5)
WBC: 13.2 10*3/uL — ABNORMAL HIGH (ref 4.0–10.5)
WBC: 13.6 K/uL — ABNORMAL HIGH (ref 4.0–10.5)
WBC: 5.2 K/uL (ref 4.0–10.5)
WBC: 6.2 K/uL (ref 4.0–10.5)
WBC: 7.6 K/uL (ref 4.0–10.5)
WBC: 8.6 10*3/uL (ref 4.0–10.5)

## 2010-08-18 LAB — COMPREHENSIVE METABOLIC PANEL WITH GFR
ALT: 40 U/L (ref 0–53)
ALT: 45 U/L (ref 0–53)
AST: 27 U/L (ref 0–37)
AST: 48 U/L — ABNORMAL HIGH (ref 0–37)
Albumin: 2.1 g/dL — ABNORMAL LOW (ref 3.5–5.2)
Albumin: 2.3 g/dL — ABNORMAL LOW (ref 3.5–5.2)
Alkaline Phosphatase: 67 U/L (ref 39–117)
Alkaline Phosphatase: 73 U/L (ref 39–117)
BUN: 20 mg/dL (ref 6–23)
BUN: 35 mg/dL — ABNORMAL HIGH (ref 6–23)
CO2: 29 meq/L (ref 19–32)
CO2: 33 meq/L — ABNORMAL HIGH (ref 19–32)
Calcium: 8.5 mg/dL (ref 8.4–10.5)
Calcium: 8.5 mg/dL (ref 8.4–10.5)
Chloride: 100 meq/L (ref 96–112)
Chloride: 98 meq/L (ref 96–112)
Creatinine, Ser: 0.89 mg/dL (ref 0.4–1.5)
Creatinine, Ser: 1.04 mg/dL (ref 0.4–1.5)
GFR calc non Af Amer: >60 mL/min
GFR calc non Af Amer: >60 mL/min
Glucose, Bld: 127 mg/dL — ABNORMAL HIGH (ref 70–99)
Glucose, Bld: 160 mg/dL — ABNORMAL HIGH (ref 70–99)
Potassium: 3.7 meq/L (ref 3.5–5.1)
Potassium: 3.8 meq/L (ref 3.5–5.1)
Sodium: 134 meq/L — ABNORMAL LOW (ref 135–145)
Sodium: 139 meq/L (ref 135–145)
Total Bilirubin: 1.3 mg/dL — ABNORMAL HIGH (ref 0.3–1.2)
Total Bilirubin: 1.8 mg/dL — ABNORMAL HIGH (ref 0.3–1.2)
Total Protein: 4.9 g/dL — ABNORMAL LOW (ref 6.0–8.3)
Total Protein: 5.3 g/dL — ABNORMAL LOW (ref 6.0–8.3)

## 2010-08-18 LAB — CARDIAC PANEL(CRET KIN+CKTOT+MB+TROPI)
CK, MB: 67.3 ng/mL — ABNORMAL HIGH (ref 0.3–4.0)
Relative Index: 0.9 (ref 0.0–2.5)
Total CK: 7571 U/L — ABNORMAL HIGH (ref 7–232)

## 2010-08-18 LAB — BASIC METABOLIC PANEL WITH GFR
BUN: 15 mg/dL (ref 6–23)
BUN: 25 mg/dL — ABNORMAL HIGH (ref 6–23)
BUN: 28 mg/dL — ABNORMAL HIGH (ref 6–23)
BUN: 31 mg/dL — ABNORMAL HIGH (ref 6–23)
BUN: 34 mg/dL — ABNORMAL HIGH (ref 6–23)
BUN: 49 mg/dL — ABNORMAL HIGH (ref 6–23)
CO2: 22 meq/L (ref 19–32)
CO2: 25 meq/L (ref 19–32)
CO2: 27 meq/L (ref 19–32)
CO2: 29 meq/L (ref 19–32)
CO2: 30 meq/L (ref 19–32)
CO2: 30 meq/L (ref 19–32)
Calcium: 7.6 mg/dL — ABNORMAL LOW (ref 8.4–10.5)
Calcium: 7.6 mg/dL — ABNORMAL LOW (ref 8.4–10.5)
Calcium: 7.9 mg/dL — ABNORMAL LOW (ref 8.4–10.5)
Calcium: 8.1 mg/dL — ABNORMAL LOW (ref 8.4–10.5)
Calcium: 8.3 mg/dL — ABNORMAL LOW (ref 8.4–10.5)
Calcium: 8.7 mg/dL (ref 8.4–10.5)
Chloride: 102 meq/L (ref 96–112)
Chloride: 102 meq/L (ref 96–112)
Chloride: 105 meq/L (ref 96–112)
Chloride: 109 meq/L (ref 96–112)
Chloride: 95 meq/L — ABNORMAL LOW (ref 96–112)
Chloride: 98 meq/L (ref 96–112)
Creatinine, Ser: 0.79 mg/dL (ref 0.4–1.5)
Creatinine, Ser: 0.95 mg/dL (ref 0.4–1.5)
Creatinine, Ser: 1.01 mg/dL (ref 0.4–1.5)
Creatinine, Ser: 1.13 mg/dL (ref 0.4–1.5)
Creatinine, Ser: 1.97 mg/dL — ABNORMAL HIGH (ref 0.4–1.5)
Creatinine, Ser: 2.55 mg/dL — ABNORMAL HIGH (ref 0.4–1.5)
GFR calc non Af Amer: 26 mL/min — ABNORMAL LOW
GFR calc non Af Amer: 35 mL/min — ABNORMAL LOW
GFR calc non Af Amer: >60 mL/min
GFR calc non Af Amer: >60 mL/min
GFR calc non Af Amer: >60 mL/min
GFR calc non Af Amer: >60 mL/min
Glucose, Bld: 122 mg/dL — ABNORMAL HIGH (ref 70–99)
Glucose, Bld: 127 mg/dL — ABNORMAL HIGH (ref 70–99)
Glucose, Bld: 138 mg/dL — ABNORMAL HIGH (ref 70–99)
Glucose, Bld: 155 mg/dL — ABNORMAL HIGH (ref 70–99)
Glucose, Bld: 157 mg/dL — ABNORMAL HIGH (ref 70–99)
Glucose, Bld: 247 mg/dL — ABNORMAL HIGH (ref 70–99)
Potassium: 3.7 meq/L (ref 3.5–5.1)
Potassium: 3.9 meq/L (ref 3.5–5.1)
Potassium: 4 meq/L (ref 3.5–5.1)
Potassium: 4 meq/L (ref 3.5–5.1)
Potassium: 4.4 meq/L (ref 3.5–5.1)
Potassium: 4.5 meq/L (ref 3.5–5.1)
Sodium: 132 meq/L — ABNORMAL LOW (ref 135–145)
Sodium: 134 meq/L — ABNORMAL LOW (ref 135–145)
Sodium: 136 meq/L (ref 135–145)
Sodium: 138 meq/L (ref 135–145)
Sodium: 140 meq/L (ref 135–145)
Sodium: 141 meq/L (ref 135–145)

## 2010-08-18 LAB — BASIC METABOLIC PANEL
BUN: 5 mg/dL — ABNORMAL LOW (ref 6–23)
BUN: 9 mg/dL (ref 6–23)
BUN: 20 mg/dL (ref 6–23)
BUN: 35 mg/dL — ABNORMAL HIGH (ref 6–23)
BUN: 46 mg/dL — ABNORMAL HIGH (ref 6–23)
CO2: 29 mEq/L (ref 19–32)
CO2: 29 mEq/L (ref 19–32)
CO2: 22 mEq/L (ref 19–32)
CO2: 23 mEq/L (ref 19–32)
CO2: 27 mEq/L (ref 19–32)
Calcium: 8.6 mg/dL (ref 8.4–10.5)
Calcium: 8.7 mg/dL (ref 8.4–10.5)
Calcium: 7.6 mg/dL — ABNORMAL LOW (ref 8.4–10.5)
Calcium: 7.7 mg/dL — ABNORMAL LOW (ref 8.4–10.5)
Calcium: 7.8 mg/dL — ABNORMAL LOW (ref 8.4–10.5)
Chloride: 103 mEq/L (ref 96–112)
Chloride: 105 mEq/L (ref 96–112)
Chloride: 105 mEq/L (ref 96–112)
Chloride: 106 mEq/L (ref 96–112)
Chloride: 108 mEq/L (ref 96–112)
Creatinine, Ser: 0.72 mg/dL (ref 0.4–1.5)
Creatinine, Ser: 0.77 mg/dL (ref 0.4–1.5)
Creatinine, Ser: 1.67 mg/dL — ABNORMAL HIGH (ref 0.4–1.5)
Creatinine, Ser: 1.87 mg/dL — ABNORMAL HIGH (ref 0.4–1.5)
Creatinine, Ser: 2.41 mg/dL — ABNORMAL HIGH (ref 0.4–1.5)
GFR calc Af Amer: >60 mL/min (ref >60)
GFR calc Af Amer: >60 mL/min (ref >60)
GFR calc Af Amer: 33 mL/min — ABNORMAL LOW (ref >60)
GFR calc Af Amer: 44 mL/min — ABNORMAL LOW (ref >60)
GFR calc Af Amer: 51 mL/min — ABNORMAL LOW (ref >60)
GFR calc non Af Amer: >60 mL/min (ref >60)
GFR calc non Af Amer: >60 mL/min (ref >60)
GFR calc non Af Amer: 27 mL/min — ABNORMAL LOW (ref >60)
GFR calc non Af Amer: 37 mL/min — ABNORMAL LOW (ref >60)
GFR calc non Af Amer: 42 mL/min — ABNORMAL LOW (ref >60)
Glucose, Bld: 116 mg/dL — ABNORMAL HIGH (ref 70–99)
Glucose, Bld: 116 mg/dL — ABNORMAL HIGH (ref 70–99)
Glucose, Bld: 172 mg/dL — ABNORMAL HIGH (ref 70–99)
Glucose, Bld: 190 mg/dL — ABNORMAL HIGH (ref 70–99)
Glucose, Bld: 269 mg/dL — ABNORMAL HIGH (ref 70–99)
Potassium: 3.5 mEq/L (ref 3.5–5.1)
Potassium: 3.6 mEq/L (ref 3.5–5.1)
Potassium: 4.3 mEq/L (ref 3.5–5.1)
Potassium: 4.4 mEq/L (ref 3.5–5.1)
Potassium: 4.5 mEq/L (ref 3.5–5.1)
Sodium: 140 mEq/L (ref 135–145)
Sodium: 142 mEq/L (ref 135–145)
Sodium: 138 mEq/L (ref 135–145)
Sodium: 139 mEq/L (ref 135–145)
Sodium: 141 mEq/L (ref 135–145)

## 2010-08-18 LAB — POCT I-STAT 7, (LYTES, BLD GAS, ICA,H+H)
Acid-Base Excess: 1 mmol/L (ref 0.0–2.0)
Acid-base deficit: 2 mmol/L (ref 0.0–2.0)
Bicarbonate: 23.3 mEq/L (ref 20.0–24.0)
Bicarbonate: 25.6 mEq/L — ABNORMAL HIGH (ref 20.0–24.0)
Calcium, Ion: 1.13 mmol/L (ref 1.12–1.32)
Calcium, Ion: 1.13 mmol/L (ref 1.12–1.32)
HCT: 34 % — ABNORMAL LOW (ref 39.0–52.0)
HCT: 36 % — ABNORMAL LOW (ref 39.0–52.0)
Hemoglobin: 11.6 g/dL — ABNORMAL LOW (ref 13.0–17.0)
Hemoglobin: 12.2 g/dL — ABNORMAL LOW (ref 13.0–17.0)
O2 Saturation: 100 %
O2 Saturation: 100 %
Patient temperature: 36.6
Patient temperature: 37.3
Potassium: 3.4 mEq/L — ABNORMAL LOW (ref 3.5–5.1)
Potassium: 3.7 mEq/L (ref 3.5–5.1)
Sodium: 138 mEq/L (ref 135–145)
Sodium: 139 mEq/L (ref 135–145)
TCO2: 24 mmol/L (ref 0–100)
TCO2: 27 mmol/L (ref 0–100)
pCO2 arterial: 38.4 mmHg (ref 35.0–45.0)
pCO2 arterial: 39.8 mmHg (ref 35.0–45.0)
pH, Arterial: 7.376 (ref 7.350–7.450)
pH, Arterial: 7.43 (ref 7.350–7.450)
pO2, Arterial: 209 mmHg — ABNORMAL HIGH (ref 80.0–100.0)
pO2, Arterial: 216 mmHg — ABNORMAL HIGH (ref 80.0–100.0)

## 2010-08-18 LAB — PROTIME-INR
INR: 1.3 (ref 0.00–1.49)
INR: 1.4 (ref 0.00–1.49)
INR: 1.5 (ref 0.00–1.49)
INR: 1.5 (ref 0.00–1.49)
INR: 1.7 — ABNORMAL HIGH (ref 0.00–1.49)
INR: 1.7 — ABNORMAL HIGH (ref 0.00–1.49)
INR: 2 — ABNORMAL HIGH (ref 0.00–1.49)
INR: 2.2 — ABNORMAL HIGH (ref 0.00–1.49)
INR: 2.3 — ABNORMAL HIGH (ref 0.00–1.49)
INR: 1.2 (ref 0.00–1.49)
INR: 1.3 (ref 0.00–1.49)
INR: 1.3 (ref 0.00–1.49)
INR: 1.3 (ref 0.00–1.49)
Prothrombin Time: 16.6 seconds — ABNORMAL HIGH (ref 11.6–15.2)
Prothrombin Time: 17.4 seconds — ABNORMAL HIGH (ref 11.6–15.2)
Prothrombin Time: 18.7 seconds — ABNORMAL HIGH (ref 11.6–15.2)
Prothrombin Time: 18.7 seconds — ABNORMAL HIGH (ref 11.6–15.2)
Prothrombin Time: 20.6 seconds — ABNORMAL HIGH (ref 11.6–15.2)
Prothrombin Time: 21 seconds — ABNORMAL HIGH (ref 11.6–15.2)
Prothrombin Time: 23.8 seconds — ABNORMAL HIGH (ref 11.6–15.2)
Prothrombin Time: 25.4 seconds — ABNORMAL HIGH (ref 11.6–15.2)
Prothrombin Time: 26.9 seconds — ABNORMAL HIGH (ref 11.6–15.2)
Prothrombin Time: 15.8 seconds — ABNORMAL HIGH (ref 11.6–15.2)
Prothrombin Time: 16.7 seconds — ABNORMAL HIGH (ref 11.6–15.2)
Prothrombin Time: 17.1 seconds — ABNORMAL HIGH (ref 11.6–15.2)
Prothrombin Time: 17.1 seconds — ABNORMAL HIGH (ref 11.6–15.2)

## 2010-08-18 LAB — DIFFERENTIAL
Basophils Absolute: 0 10*3/uL (ref 0.0–0.1)
Basophils Absolute: 0 10*3/uL (ref 0.0–0.1)
Basophils Absolute: 0 10*3/uL (ref 0.0–0.1)
Basophils Relative: 0 % (ref 0–1)
Basophils Relative: 0 % (ref 0–1)
Basophils Relative: 0 % (ref 0–1)
Eosinophils Absolute: 0.1 10*3/uL (ref 0.0–0.7)
Eosinophils Absolute: 0 10*3/uL (ref 0.0–0.7)
Eosinophils Absolute: 0.1 10*3/uL (ref 0.0–0.7)
Eosinophils Relative: 1 % (ref 0–5)
Eosinophils Relative: 0 % (ref 0–5)
Eosinophils Relative: 1 % (ref 0–5)
Lymphocytes Relative: 20 % (ref 12–46)
Lymphocytes Relative: 12 % (ref 12–46)
Lymphocytes Relative: 9 % — ABNORMAL LOW (ref 12–46)
Lymphs Abs: 1.2 10*3/uL (ref 0.7–4.0)
Lymphs Abs: 1 10*3/uL (ref 0.7–4.0)
Lymphs Abs: 1.3 10*3/uL (ref 0.7–4.0)
Monocytes Absolute: 0.7 10*3/uL (ref 0.1–1.0)
Monocytes Absolute: 1.4 10*3/uL — ABNORMAL HIGH (ref 0.1–1.0)
Monocytes Absolute: 1.4 10*3/uL — ABNORMAL HIGH (ref 0.1–1.0)
Monocytes Relative: 12 % (ref 3–12)
Monocytes Relative: 12 % (ref 3–12)
Monocytes Relative: 13 % — ABNORMAL HIGH (ref 3–12)
Neutro Abs: 4.1 10*3/uL (ref 1.7–7.7)
Neutro Abs: 8.7 10*3/uL — ABNORMAL HIGH (ref 1.7–7.7)
Neutro Abs: 8.8 10*3/uL — ABNORMAL HIGH (ref 1.7–7.7)
Neutrophils Relative %: 67 % (ref 43–77)
Neutrophils Relative %: 76 % (ref 43–77)
Neutrophils Relative %: 77 % (ref 43–77)

## 2010-08-18 LAB — APTT
aPTT: 25 s (ref 24–37)
aPTT: 25 seconds (ref 24–37)
aPTT: 27 seconds (ref 24–37)
aPTT: 29 seconds (ref 24–37)

## 2010-08-18 LAB — CROSSMATCH
ABO/RH(D): A POS
Antibody Screen: NEGATIVE

## 2010-08-18 LAB — HEPARIN LEVEL (UNFRACTIONATED)
Heparin Unfractionated: 0.1 IU/mL — ABNORMAL LOW (ref 0.30–0.70)
Heparin Unfractionated: 0.11 IU/mL — ABNORMAL LOW (ref 0.30–0.70)
Heparin Unfractionated: 0.33 IU/mL (ref 0.30–0.70)
Heparin Unfractionated: 0.35 IU/mL (ref 0.30–0.70)
Heparin Unfractionated: 0.41 IU/mL (ref 0.30–0.70)
Heparin Unfractionated: <0.1 IU/mL — ABNORMAL LOW (ref 0.30–0.70)

## 2010-08-18 LAB — CK TOTAL AND CKMB (NOT AT ARMC)
CK, MB: 6.7 ng/mL — ABNORMAL HIGH (ref 0.3–4.0)
Relative Index: 6.7 — ABNORMAL HIGH (ref 0.0–2.5)
Total CK: 100 U/L (ref 7–232)

## 2010-08-18 LAB — MAGNESIUM
Magnesium: 1.7 mg/dL (ref 1.5–2.5)
Magnesium: 2.2 mg/dL (ref 1.5–2.5)

## 2010-08-18 LAB — TSH: TSH: 1.07 u[IU]/mL (ref 0.350–4.500)

## 2010-08-18 LAB — CALCIUM: Calcium: 7.5 mg/dL — ABNORMAL LOW (ref 8.4–10.5)

## 2010-08-18 LAB — TROPONIN I: Troponin I: 0.02 ng/mL (ref 0.00–0.06)

## 2010-08-18 LAB — PHOSPHORUS: Phosphorus: 3.8 mg/dL (ref 2.3–4.6)

## 2010-08-19 LAB — BASIC METABOLIC PANEL
BUN: 21 mg/dL (ref 6–23)
CO2: 25 mEq/L (ref 19–32)
Calcium: 9.9 mg/dL (ref 8.4–10.5)
Chloride: 104 mEq/L (ref 96–112)
Creatinine, Ser: 1.05 mg/dL (ref 0.4–1.5)
GFR calc Af Amer: >60 mL/min (ref >60)
GFR calc non Af Amer: >60 mL/min (ref >60)
Glucose, Bld: 103 mg/dL — ABNORMAL HIGH (ref 70–99)
Potassium: 3.6 mEq/L (ref 3.5–5.1)
Sodium: 140 mEq/L (ref 135–145)

## 2010-08-19 LAB — ABO/RH: ABO/RH(D): A POS

## 2010-08-19 LAB — CBC
HCT: 47 % (ref 39.0–52.0)
Hemoglobin: 16.6 g/dL (ref 13.0–17.0)
MCHC: 35.2 g/dL (ref 30.0–36.0)
MCV: 88.3 fL (ref 78.0–100.0)
Platelets: 167 10*3/uL (ref 150–400)
RBC: 5.32 MIL/uL (ref 4.22–5.81)
RDW: 14 % (ref 11.5–15.5)
WBC: 7.8 10*3/uL (ref 4.0–10.5)

## 2010-08-19 LAB — TYPE AND SCREEN
ABO/RH(D): A POS
Antibody Screen: NEGATIVE

## 2010-09-22 NOTE — Op Note (Signed)
NAMEJOCELYN, LOWERY               ACCOUNT NO.:  [id number]   MEDICAL RECORD NO.:  [id number]          PATIENT TYPE:  INP   LOCATION:  3172                         FACILITY:  [facility]   PHYSICIAN:  Danae Orleans. Venetia Maxon, M.D.  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   PROCEDURE:  1. Removal of plate C5 through C7.  2. Exploration of fusion C5 through C7.  3. C6 corpectomy.  4. PEEK cage (2 mm with morcellized bone autograft).  5. C5 through C7 plating.  6. Right carpal tunnel release.   SURGEON:  Danae Orleans. Venetia Maxon, M.D.   ASSISTANT:  Coletta Memos, M.D.  Georgiann Cocker, RN   ANESTHESIA:  General endotracheal anesthesia.   BLOOD LOSS:  200 mL.   COMPLICATIONS:  None.   DISPOSITION:  To recovery.   INDICATIONS:  Antonio Romero is a 65 year old man who has previously  undergone anterior cervical fusion at C5 through C7 levels at another  institution.  He had severe persistent cord compression over these  levels despite a fusion having been accomplished and has bilateral upper  extremity weakness to a very severe degree. Additionally, he has  bilateral carpal tunnel syndrome.  We elected to take him to surgery for  removal of the previously placed plate, exploration of the fusion, and  then to perform a corpectomy of C6 with anterior PEEK cage from C5 to C7  with the anterior cervical plating at C5 to C7 and, also, to do a right  carpal tunnel  release.   DESCRIPTION OF PROCEDURE:  Mr. Cottrill was brought to the operating  room.  Following satisfactory and uncomplicated induction of general  endotracheal anesthesia and placement of intravenous lines and a Foley  catheter, the patient was placed in the supine position on the operating  table.  His neck was maintained in neutral alignment.  He initially  underwent a Betadine scrub and prepping of the right upper extremity and  this was placed in a sterile stockinette with extremity drape.  An  incision was marked along the line of the fourth ray from the distal  wrist crease 2 cm into the palmar surface of the hand. After  infiltrating with lidocaine, it was carried to the flexor retinaculum  which appeared to be very thickened which was incised, decompressing the  carpal tunnel both distally into the palm of the hand as well as more  proximally along the volar wrist and forearm.  There appeared to be  excellent decompression at this point.  This was done under loupe  magnification. The incision was then closed with 3-0 nylon  vertical  mattress sutures and a sterile occlusive dressing was placed.   The patient was then placed in the supine position with the head on a  horseshoe head holder and he was placed in 10 pounds halter traction.  His anterior neck was then prepped and draped in the usual sterile  fashion using DuraPrep.  The previous right sided incision was reopened  and careful dissection was made through scar tissue to the anterior  cervical spine, keeping the carotid sheath lateral and trachea and  esophagus medial.  There was dense scar tissue overlying the previously  placed plate and it was extremely difficult to remove the plate.  Initially, the scar tissue was incised and then removed in piecemeal  fashion and there was bony overgrowth overlying the plate.  This was  also removed with a variety of upbiting instruments. Subsequently, the  plate was exposed, the  screws were removed, and the plate was removed.   The previously fused levels were then explored and it was possible to  get a dissecting instrument laterally to define the edges of the  previously placed fusion but, at the same time, there appeared to be  solid arthrodesis at the previously operated levels.  Subsequently, the  self-retaining retractors were placed using Shadowline retractor system  after the longus colli muscles were taken down bilaterally.  Then, using  a high speed drill, initially under loupe magnification and subsequently  under microscopic visualization, a corpectomy of C6 was performed and  this was carried to expose the dura.  Subsequently, using 1 and 2 mm  gold tip Kerrison rongeurs, the dura was decompressed from C5 to C7 and  large osteophytes were removed with apparent significant decompression  of the thecal sac and spinal cord dura.  Hemostasis was assured with  Gelfoam soaked in thrombin.  After trial sizing a 20 mm small PEEK  corpectomy cage was packed with morcellized bone autograft, which had  been saved from the corpectomy, and was then inserted into the  interspace and countersunk appropriately.  The traction weight was  removed.  A 34 mm tressel anterior cervical plate was then affixed to  the anterior cervical spine using 14 mm variable angle screws. 14.5 mm  rescue screws were used at the previous screw holes inferiorly and then  4 x 14 mm variable screws were placed at C5 in new holes.  The screw  appeared to be well opposed to the anterior cervical spine.   Hemostasis was assured.  A 7 mm JP drain was placed through a separate  stab incision and this was anchored with a 3-0 nylon stitch.  The  platysma layer was then closed with 3-0 Vicryl suture and skin edges  were approximated with 3-0 Vicryl subcuticular stitch.  Prior to closing  the skin, a final x-ray was obtained which demonstrated the upper aspect  of the construct with the screws in  C5, because of the patient's large  body habitus, it was not possible to visualize below this. The wound was  dressed with Benzoin, Steri-Strips, Telfa gauze and tape.  The patient  was awakened in the operating room and taken to the recovery room in  stable satisfactory condition having tolerated the operation well.  Counts were correct at the end of the case.      Danae Orleans. Venetia Maxon, M.D.  Electronically Signed     JDS/MEDQ  D:  12/16/2006  T:  12/17/2006  Job:  [id number]

## 2010-09-22 NOTE — Consult Note (Signed)
Antonio Romero, Antonio Romero               ACCOUNT NO.:  [id number]   MEDICAL RECORD NO.:  [id number]          PATIENT TYPE:  INP   LOCATION:  3102                         FACILITY:  [facility]   PHYSICIAN:  Michiel Cowboy, MDDATE OF BIRTH:  [birth year]   DATE OF CONSULTATION:  09/23/2008  DATE OF DISCHARGE:                                 CONSULTATION   REQUESTING PHYSICIAN:  Dr. Venetia Maxon.   REASON FOR CONSULTATION:  Lower extremity DVT despite being on  prophylactic Lovenox.   This is a 65 year old gentleman with a history of hypertension, followed  by Dr. Marden Noble, who was admitted for severe back pain and 4th  underwent L1 S1 decompressive laminectomy as well as pedicle screw  fixation.  A hospital stay thereafter was complicated by ileus.  The  patient developed tachypnea on the 9th of May.  A critical care consult  was called.  He was transferred to the ICU although he was never really  hypoxic.  It was thought to be that his tachypnea was secondary to ileus  and abdominal distention.  This has improved since after decompression  and improvement of his ileus.  On the 12th of May, after having a lot of  pain in left calf, a Doppler was done that showed a left DVT.  After a  discussion with neurosurgery, it was decided he needs to be on  prophylactic Lovenox.  Neurosurgery at that time was not comfortable  with full-dose Lovenox.  The patient remained not very ambulatory,  staying in bed and continued to have progressive pain in his left calf.  A repeat Doppler scan today on the right showed a segment of PTV in  proximal mid calf appears to be thrombosed and a left DVT throughout the  PTV from ankle to proximal calf with no propagation to popliteal vein.  Of note, on the 12th his Dopplers showed no clot on the right and  indeterminate age deep vein thrombosis involving left lower extremity  with left posterior tibial being noncompressible.  It is not quite clear  from preliminary  results that the clot on the left has actually  progressed, but the clot on the right definitely appears to be new  though to short segment.   Also, while he was hospitalized the patient struggled with anemia which  was felt post surgical and his counts improved apparently without  transfusion although he has not had labs for the past 2 days, but on the  15th his hemoglobin was up to 10 from 8.6 before.   PAST MEDICAL HISTORY:  Significant for hypertension.   SOCIAL HISTORY:  The patient does not smoke or drink, does not use  drugs.   FAMILY HISTORY:  No history of deep vein thrombosis in his family.  No  coronary artery disease in his family.  Otherwise, unremarkable.   ALLERGIES:  No known drug allergies.   MEDICATIONS:  At home he was taking:  1. Norvasc 5 mg daily.  2. Hyzaar 10/12.5 mg daily.  3. Metoprolol 50 mg daily.   Currently here, he is on:  1. Cozaar 100  mg daily.  2. Toprol XL 50 mg daily.  3. Norvasc 5 mg daily.  4. Aspirin 325.  5. Protonix 40 mg daily.  6. Reglan 10 mg q.6 h.  7. Colace 100 mg b.i.d.  8. Hydrochlorothiazide 12.5.  9. Coumadin recently started today.  10.Heparin drip started today.   His PRNs include Ventolin, Tylenol, Ambien, Vicodin, Percocet,  Roxicodone, Dilaudid every 4 hours as needed, morphine IV as needed,  Senokot, Fleet Enema, Dulcolax, Atarax, Robaxin, Flexeril, Vistaril.   REVIEW OF SYSTEMS:  The patient denies any chest pain or shortness of  breath, no wheezing.  Endorses bowel movement yesterday and passing gas  currently.  No nausea, no vomiting.  Endorses left calf pain but  otherwise unremarkable.   PHYSICAL EXAMINATION:  VITAL SIGNS:  Patient is afebrile 98.2, heart  rate between 80s to 90s, slightly elevated to 103 currently, blood  pressure 147/88, respirations 20, saturating 97% on room air.  GENERAL:  This 65 gentleman appears to be still in some pain.  HEAD:  Nontraumatic, moist mucous membranes.   LUNGS:  Clear to auscultation bilaterally but somewhat diminished  bilaterally.  HEART:  Regular rate and rhythm.  No murmurs, rubs or gallops  appreciated.  ABDOMEN:  Bowel sounds present, soft.  EXTREMITIES:  Mild generalized tenderness lower extremities.  Both lower  extremities no pitting edema but appear to be slightly enlarged in  diameter.  NEUROLOGIC:  The patient appears to be intact.  SKIN:  Appears to be clean, dry, intact.   LABORATORY DATA:  No labs obtained today.   STUDIES:  Chest x-ray on the 15th showing low lung volumes and a KUB on  the 13th showing persistent postoperative ileus.   ASSESSMENT:  This is a 65 year old gentleman with deep vein thrombosis  progression on a prophylactic dose of Lovenox.  Initial deep vein  thrombosis was most likely postoperative in origin.  Patient does not  have any significant other risk factors for deep vein thrombosis.  His  last colonoscopy reportedly was in February.  He has no family history  of clotting disorders.  1. Deep vein thrombosis.  Agree strongly with heparin and Coumadin.  I      would give him a good trial of heparin and Coumadin since he did      not technically fail the treatment, he failed prophylaxis with      significant perioperative risk factors such as poor ambulation,      prolonged hospital stay.  Would hold off on hypercoagulable panel      right now and malignancy workup as I think he has a very good      reason for developing deep vein thrombosis.  Would defer to the      primary care Karuna Balducci if he feels otherwise.  Would follow a CBC      while on heparin daily.  If shortness of breath or chest pain      develops, which he currently denies, would order a CT scan of the      chest.  If patient develops a pulmonary embolism despite being on      heparin and Coumadin then an IVC filter would be indicated.  2. Anemia.  Follow with CBC.  If persists or worsens, consider other      etiologies, would  Hemoccult stool, obtain anemia panel but if      improves we will hold off on that.  3. Ileus.  Had a bowel movement yesterday,  had bowel sounds currently.      Abdomen is soft, I think is improving.  Will continue to avoid      narcotics, good bowel regimen, continue Reglan and Protonix.  4. History of hypertension while in-house.  Would hold      hydrochlorothiazide for now as patient to immediately      begin IV fluids.  5. His other medications could be titrated as needed.  6. Prophylaxis.  The patient is already on Protonix and will be now on      heparin.      Michiel Cowboy, MD  Electronically Signed     AVD/MEDQ  D:  09/23/2008  T:  09/23/2008  Job:  [id number]   cc:   Candyce Churn, M.D.  Danae Orleans. Venetia Maxon, M.D.

## 2010-09-22 NOTE — H&P (Signed)
NAMEDAREN, YEAGLE               ACCOUNT NO.:  [id number]   MEDICAL RECORD NO.:  [id number]          PATIENT TYPE:  IPS   LOCATION:  4032                         FACILITY:  [facility]   PHYSICIAN:  Erick Colace, M.D.DATE OF BIRTH:  1946/04/26   DATE OF ADMISSION:  09/26/2008  DATE OF DISCHARGE:                              HISTORY & PHYSICAL   CHIEF COMPLAINT:  Back pain.   REASON FOR ADMISSION:  Rehabilitation following lumbar surgery,  postoperative complications.   Mr. Brosch is a 65 year old male with history of cervical myelopathy in  the past status post ACDF x2.  He also had prior lumbar surgery and  developed increasing low back pain secondary to lumbar spinal stenosis.  He had biforaminal stenosis L2 through sacral area as well as scoliosis.  He underwent L1 through S1 redo laminectomy with decompression and  arthrodesis by Dr. Reed Pandy. Venetia Maxon.   Postop course was complicated by ileus requiring NG tube.  He also had  tachypnea along with left lower extremity edema.  Bilateral lower  extremity Dopplers demonstrate left DVT, which was small in the  posterior tibial nerve treated with prophylactic dose of Lovenox;  however, developed right mid calf DVT and extension of left DVT as well.  He therefore was evaluated by Hospitalist, recommended IV heparin and  Coumadin.  He had a few beats of V-tach on Sep 24, 2008, beta-blockers  were resumed.  A 2-D echo was performed.   The patient was started in physical therapy and occupational therapy.   Seen by physical medicine rehabilitation consultant and felt to be a  good rehabilitation candidate.   REVIEW OF SYSTEMS:  Positive for weakness in her legs, he thinks that it  was mainly due to just being in bed a lot.  He has back pain.  His  remainder of review of systems is negative.  Please see history and  physical form for full review.  Has had a BM today.  Has been urinating  frequently with the institution of IV  heparin.   PAST MEDICAL HISTORY:  1. Hypertension.  2. Nephrolithiasis.  3. Bilateral carpal tunnel release.  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  5. Nonobstructive CAD.   PAST SURGICAL HISTORY:  As noted above, cervical decompression in 2007  C3-C7, and in 2008 redo with history of cervical myelopathy.   FAMILY HISTORY:  CAD.   SOCIAL HISTORY:  Married, lives in a split-level home, one step to enter  for the main living quarter.  No tobacco.  No ethanol use.   FUNCTIONAL HISTORY:  Independent driving prior to admission.   HOME MEDICATIONS:  1. Hyzaar 10/12.5 daily.  2. Metoprolol ER 50 mg p.o. daily.  3. Norvasc 5 mg p.o. daily.   ALLERGIES:  NITROGLYCERIN.   CURRENT MEDICATIONS:  1. IV heparin drip as per PTT.  2. Coumadin per pharmacy protocol.  3. Toprol-XL 50 mg p.o. daily.  4. Cozaar 100 mg p.o. daily.  5. Hydrochlorothiazide 12.5 p.o. daily.  6. Protonix 40 mg p.o. daily.  7. MiraLax 17 g in 8 ounces of water p.o. daily.  8.  Dulcolax suppository one per rectum daily p.r.n.  9. Ultram 50 mg p.o. q.i.d.  10.Ferrous sulfate 325 p.o. b.i.d.   DIET:  Regular.   PHYSICAL EXAMINATION:  VITAL SIGNS:  Blood pressure 163/95, pulse 82,  respirations 18, temp 98.2.  GENERAL:  Well-developed, well-nourished male in no acute distress.  HEENT:  Eyes anicteric.  He does have small conjunctival hemorrhage on  the right side.  External ENT normal.  Dentition is good.  NECK:  Supple without adenopathy.  Decreased neck range of motion.  Respiratory effort is good.  LUNGS:  Clear.  HEART:  Regular rate and rhythm.  No murmurs, rubs, or extra sounds.  EXTREMITIES:  Without edema.  No tenderness in the calves.  Pedal pulses  are normal.  No lower extremity edema.  Gait is as noted below.  Range  of motion is reduced in bilateral lower extremity.  He has restrictions  per his report in terms of upper extremity overhead activity.  He has  motor strength graded at 5/5 bilateral  deltoid, biceps, triceps, finger  flexors, 4/5 bilateral hip flexor, quad, TA, and gastroc.  Sensation is  mildly reduced bilateral hands, intact in the feet and legs.  ABDOMEN:  Positive bowel sounds.  Soft, nontender.  No hepatomegaly or  tenderness to palpation.  BACK:  Proximal aspect incision with swelling, incision intact.  Clean  and dry with mild irritation.  NEURO:  Mood, memory, and affect are all normal.   POST ADMISSION PHYSICIAN EVALUATION:  1. Functional deficits secondary to lumbar spinal stenosis with      radiculopathy status redo fusion L1-S1 with no postoperative      complications noted above.  2. The patient admitted to receive collaborative interdisciplinary      care between the physiatrist, rehab nursing staff, and therapy      team.  3. The patient's level of medical complexity and substantial therapy      needs in context of that medical necessity cannot be provided at a      lesser intensity of care such as SNF.  4. The patient has experienced substantial functional loss from his      baseline.  Upon functional examination at the time of preadmission      screening, the patient was at a min assist transfers, min assist      ambulation 30 feet with a rolling walker, min assist with dressing.      On functional evaluation today, he is min assist with toileting,      min guard assist with bed mobility, min assist with transfers, min      guard with rolling walker for ambulation 30 feet.  Judging by the      patient's diagnosis, physical exam, and functional history, the      patient has a potential for functional progress, which will result      in measurable gains while on inpatient rehab.  These gains will be      of substantial and practical use upon discharge to home and      facilitating mobility, self-care, and independence.  Interim      changes of medical status since preadmission screening are detailed      in the history of present illness.  5.  Physiatrist will provide 24-hour rehab management of medical needs      as well as oversight of therapy plan/treatment and provide guidance      as appropriate regarding the interactions too.  6.  24-hour rehab nursing will assist in the management of skin, bowel,      bladder, incision, and help integrate therapy concepts, techniques,      and education.  7. PT will assess and treat for lower extremity range of motion      strengthening, pre-gait training, gait training, and equipment.      Goals are for a modified independent level for mobility.  8. OT will assess and treat for upper extremity strengthening, range      of motion, cognitive perceptual training, ADLs, and equipment.      Goals are for a modified independent level for upper extremity ADLs      and supervision lower body ADLs.  9. Case management and social worker will assess and treat for      psychosocial issues and discharge planning.  10.Team conference will be held weekly to assess progress, goals, and      to determine barriers to discharge.  11.The patient has demonstrated sufficient medical stability and      exercise capacity to tolerate at least 3 hours of therapy per day      at least 5 days per week.   ESTIMATED LENGTH OF STAY:  One week.   PROGNOSIS FOR FUNCTIONAL RECOVERY:  Good.   MEDICAL PROBLEM LIST AND PLAN:  1. L1-S1 redo laminectomy.  We will monitor his wound, monitor his      neurologic function now that he is on anticoagulation.  He      initially was not placed on full dose of warfarin or heparin given      his recent surgery.  2. Bilateral lower extremity DVT.  IV heparin for 5 days with Coumadin      overlap.  We will need to monitor for signs and symptoms of      bleeding.  We will monitor his hemoglobin.  3. Hypertension.  Monitor hydrochlorothiazide, Cozaar, Norvasc.  4. Arrhythmia.  Continue Toprol.  Monitor for symptomatology.  5. Ileus, resolved.  We will continue to monitor his bowel  status.  6. ABLA.  We will recheck his hemoglobin in the morning, add ferrous      sulfate supplement.      Erick Colace, M.D.  Electronically Signed     AEK/MEDQ  D:  09/26/2008  T:  09/27/2008  Job:  [id number]   cc:   Candyce Churn, M.D.  Pricilla Riffle, MD, [facility]  Danae Orleans. Venetia Maxon, M.D.

## 2010-09-22 NOTE — Discharge Summary (Signed)
NAMEDAKWAN, PRIDGEN               ACCOUNT NO.:  [id number]   MEDICAL RECORD NO.:  [id number]          PATIENT TYPE:  IPS   LOCATION:  4032                         FACILITY:  [facility]   PHYSICIAN:  Ranelle Oyster, M.D.DATE OF BIRTH:  [birth year]   DATE OF ADMISSION:  09/26/2008  DATE OF DISCHARGE:  10/01/2008                               DISCHARGE SUMMARY   DISCHARGE DIAGNOSES:  1. L2-S1 redo lam for spondylolisthesis, stenosis, and radiculopathy.  2. Bilateral lower extremity deep vein thrombosis.  3. Hypertension.  4. History of arrhythmia.  5. Ileus.   HISTORY OF PRESENT ILLNESS:  Mr. Cavallero is a 65 year old male with  history of cervical myelopathy, ACDF x2, prior lumbar surgery with low  back pain due to severe biforaminal stenosis L2 to sacral level with  scoliosis and spondylosis.  He was admitted Sep 10, 2008, for L1-S1 redo  lam with decompression arthrodesis by Dr. Venetia Maxon.  Postop course  complicated by ileus requiring NG tube placement as well as tachypnea  and left lower extremity edema.  Bilateral lower extremity Dopplers done  showed left posterior tibial vein DVT and the patient was treated with  prophylactic dose of Lovenox and aspirin due to recent surgery.  Repeat  Dopplers done revealed right mid calf posterior tibial vein DVT as well  as left posterior tibial vein DVT from ankle to proximal calf.  He was  evaluated by Hospitalist who recommended IV heparin with transition to  Coumadin.  The patient did develop few beats of V-tach on Sep 24, 2008,  and his beta-blocker dose was resumed.  A 2-D echo was done for workup.  The patient has had initiation of therapies, which are ongoing  currently.  He is noted to have decrease in endurance level with issues  with pain as well as requiring cues for safety.  Rehab was consulted for  input and felt that he would be an appropriate rehab candidate.   PAST MEDICAL HISTORY:  Significant for,  1. Hypertension.  2.  Nephrolithiasis.  3. Bilateral carpal tunnel release.  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  5. Nonobstructive coronary artery disease.   ALLERGIES:  NITROGLYCERIN.   FAMILY HISTORY:  Positive for coronary artery disease.   SOCIAL HISTORY:  The patient is married, lives with wife in split-level  home with one-step at entry.  Does not use any tobacco or alcohol.   FUNCTIONAL HISTORY:  The patient was independent and driving prior to  admission.   FUNCTIONAL STATUS:  The patient is min assist for toileting, min guard  assist for bed mobility, min assist for transfers, min guard for  ambulating 30 feet with a rolling walker.   PHYSICAL EXAMINATION:  VITALS:  Blood pressure 163/95, pulse 82,  respiratory rate 18, temperature 98.2.  GENERAL:  The patient is a well-developed, well-nourished male in no  acute distress.  HEENT:  Eyes anicteric.  He does have small hemorrhage on right side.  Oral mucosa moist with good dentition.  Nares patent.  NECK:  Supple without adenopathy.  Decreased range of motion noted.  LUNGS:  Clear to auscultation bilaterally  without wheezes, rales, or  rhonchi.  HEART:  Regular rate and rhythm without murmurs or gallops.  EXTREMITIES:  No evidence of edema.  Range of motion is reduced.  Bilateral lower extremity has restriction per his report in terms of  upper extremity overhead activity.  He has good motor strength with 5/5  bilateral deltoid, biceps, triceps, finger flexion, 4/5 bilateral hip  flexion, quads, and gastroc.  Sensation is mildly reduced bilateral  hands.  Intact in feet and leg.  ABDOMEN:  Soft, nontender with positive bowel sounds.  No hepatomegaly  or tenderness to palpation.  SKIN:  Back incision shows some swelling at proximal aspect, otherwise  is intact, clean, and dry with some mild irritation.  NEUROLOGIC:  Memory, mood, affects are all normal.   HOSPITAL COURSE:  Mr. Andres Bantz was admitted to rehab on Sep 26, 2008, for  inpatient therapies to consist of PT/OT daily.  Past  admission, the patient was maintained on IV heparin until Coumadin at  therapeutic basis.  The patient's INR was 2.3 on Sep 26, 2008, and still  therapeutic, on Sep 27, 2008, IV heparin was discontinued .  Labs were  done past admission revealing hemoglobin 11.1, hematocrit 32.1, white  count 6.1, platelets 305.  Check of lytes revealed sodium 139, potassium  3.5, chloride 101, CO2 of 31, BUN 6, creatinine 0.74, glucose 113.  LFTs  revealed AST 18, ALT 27, alkaline phos 106, T. bili 0.9, albumin 2.5.  During the patient's stay in rehab, physiatrist, rehab RN, and therapy  team had worked together to provide customized collaborative  interdisciplinary care.  The patient's blood pressures were monitored on  b.i.d. basis and these have been reasonable ranging from 130s-140  systolics, diastolics have ranged in 80s.  The patient has had no  recurrent tachycardia with heart rate in 60-70 range.  The patient's  back incision has been monitored by rehab RN with RN working on bowel  program to help prevention of constipation issues.  The patient's wound  remained intact.  Some fluctuance of upper aspect of incision continues;  however, Neurosurgery feels this will resolve in time.   During the patient's stay in rehab, PT/OT has been ongoing for at least  3 hours 5 days a week.  Team conference was held during the patient's  stay to discuss the patient's progress, set goals, as well as discuss  barriers to discharge.  At the time of admission, the patient was  limited by decreased endurance as well as decrease in lower extremity  strength, poor activity tolerance with increased pain with movement, and  decreased ability to perform ADLs.  He required verbal cues for back  precautions.  OT evaluation revealed the patient had min assist for self-  care.  They have focused on bathing and dressing tasks as well as  reinforcement of back precautions  and donning and doffing of LSO.  The  patient requires min assist for lower body dressing with use of Adaptic  equipment.  He does require assist for hygiene and peri care.  The  patient was educated on use of toilet aid.  The patient was originally  set for discharge on Oct 02, 2008; however, the patient preferred to go  home earlier due to his improvement in mobility overall and wife being  there to provide supervision assistance as needed.  The patient was has  progressed to being modified independent for ambulating 200 feet x2.  He  was able to navigate  3 steps with supervision, able to perform car  transfers, modified independent level.  The patient to have followup  outpatient therapy to address further gait training and transition him  from rolling walker to cane.  Of note, the patient's INR was noted to be  subtherapeutic at the time of discharge at 1.5.  The patient is  discharged to home on 7.5 mg of Coumadin with cross coverage as with of  Lovenox 85 mg b.i.d. until INR greater than 2.0.  Home health RN has  been arranged for protime draws for Oct 02, 2008, and Oct 04, 2008, with  results to Dr. Kevan Ny' office.  Dr. Kevan Ny' office to adjust Coumadin dose  as needed with recommendations to discontinue Lovenox when INR  therapeutic.   DISCHARGE MEDICATIONS:  1. Lovenox 85 mg b.i.d.  2. Coated aspirin 81 mg a day.  3. Coumadin 5 mg 1-1/2 p.o. q.p.m.  4. Hyzaar 100/12.5 per day.  5. Ferrous sulfate 325 mg b.i.d.  6. Norvasc 5 mg a day.  7. MiraLax 17 g in 8 ounces per day.  8. Toprol-XL 50 mg 1-1/2 p.o. per day.  9. Ultram 50 mg q.i.d.  10.Robaxin 500 mg q.6 h. p.r.n. spasms.  11.Roxicodone 5 mg 1 q.4-6 h. p.r.n. moderate-to-severe pain, #60 Rx.  12.Tylenol as needed for pain.  13.Prilosec OTC 1 per day.   DIET:  Regular.   ACTIVITY:  Intermittent supervision.  No strenuous activity.  No  alcohol, no smoking, no driving.  Assistance with hygiene and ADLs.   SPECIAL  INSTRUCTIONS:  [facility] to provide PT and RN.  Home health nurse to draw protime on Oct 02, 2008, and Oct 04, 2008,  a.m. with results to fax to Dr. Johnella Moloney.   FOLLOWUP:  The patient to follow up with Dr. Riley Kill as needed.  Follow  up with Dr. Venetia Maxon in 2 weeks.  Follow up with Dr. Kevan Ny in 2 weeks.      Greg Cutter, P.A.      Ranelle Oyster, M.D.  Electronically Signed    PP/MEDQ  D:  10/01/2008  T:  10/02/2008  Job:  [id number]   cc:   Dr. Elisabeth Pigeon, M.D.

## 2010-09-22 NOTE — Discharge Summary (Signed)
NAMEMANFORD, Antonio Romero               ACCOUNT NO.:  [id number]   MEDICAL RECORD NO.:  [id number]          PATIENT TYPE:  INP   LOCATION:  5148                         FACILITY:  [facility]   PHYSICIAN:  Coletta Memos, M.D.     DATE OF BIRTH:  07-11-1945   DATE OF ADMISSION:  12/16/2006  DATE OF DISCHARGE:  12/18/2006                               DISCHARGE SUMMARY   ADMISSION DIAGNOSES:  1. Cervical spondylosis with myelopathy, cervical radiculopathy,      cervical stenosis, cervicalgia.  2. Bilateral arterial carpal tunnel syndrome, right worse than left.   DISCHARGE DIAGNOSES:  1. Cervical spondylosis with myelopathy, cervical radiculopathy,      cervical stenosis, cervicalgia.  2. Bilateral arterial carpal tunnel syndrome, right worse than left.   PROCEDURE:  Anterior cervical decompression via anterior corpectomy of  C.   DICTATION STOPS HERE.           ______________________________  Coletta Memos, M.D.     KC/MEDQ  D:  12/18/2006  T:  12/18/2006  Job:  [id number]

## 2010-09-22 NOTE — Op Note (Signed)
NAMEKAMRIN, SIBLEY               ACCOUNT NO.:  [id number]   MEDICAL RECORD NO.:  [id number]          PATIENT TYPE:  AMB   LOCATION:  SDS                          FACILITY:  [facility]   PHYSICIAN:  Danae Orleans. Venetia Maxon, M.D.  DATE OF BIRTH:  01-May-1946   DATE OF PROCEDURE:  03/16/2007  DATE OF DISCHARGE:                               OPERATIVE REPORT   PREOPERATIVE DIAGNOSIS:  Left carpal tunnel syndrome.   POSTOPERATIVE DIAGNOSIS:  Left carpal tunnel syndrome.   PROCEDURE:  Left carpal tunnel release.   SURGEON:  Danae Orleans. Venetia Maxon, M.D.   ANESTHESIA:  IV sedation and local anesthetic.   COMPLICATIONS:  None.   DISPOSITION:  To recovery.   INDICATIONS:  Antonio Romero is a 65 year old man with left carpal tunnel  syndrome.  It was elected to take him to surgery for a carpal tunnel  release.   PROCEDURE:  [name] was brought to the operating room.  He was  placed in supine position on the operating table.  His left upper  extremity was prepped and draped with sterile stockinette after Betadine  scrub and paint.  He was given intravenous sedation.  His left volar  wrist was draped, and area of planned incision was infiltrated with 1%  local lidocaine without epinephrine.  An incision about 3-cm long was  made in line with the fourth ray and carried through adipose tissue to  the flexor retinaculum which was then incised sharply, decompressing the  carpal tunnel more proximally under the volar forearm.  The flexor  retinaculum was incised and then more distant into the palm with good  release of the carpal tunnel.  The wound was then irrigated and closed  with 3-0 nylon vertical mattress sutures.  Sterile occlusive Kerlix and  Kling wrap was placed.  The patient was taken to recovery, having  tolerated the procedure well.      Danae Orleans. Venetia Maxon, M.D.  Electronically Signed     JDS/MEDQ  D:  03/16/2007  T:  03/16/2007  Job:  [id number]

## 2010-09-22 NOTE — Discharge Summary (Signed)
Antonio Romero, Antonio Romero               ACCOUNT NO.:  [id number]   MEDICAL RECORD NO.:  [id number]          PATIENT TYPE:  INP   LOCATION:  5148                         FACILITY:  [facility]   PHYSICIAN:  Coletta Memos, M.D.     DATE OF BIRTH:  1945-12-18   DATE OF ADMISSION:  12/16/2006  DATE OF DISCHARGE:  12/18/2006                               DISCHARGE SUMMARY   HOSPITAL COURSE:  Mr. [name] underwent an anterior cervical  decompression via a C6 corpectomy with revision of his anterior cervical  procedure.  He had an Autograft C5-7 anterior instrumentation, removal  of existing plate of W4-0 with exploration of fusion and a right carpal  tunnel release.  Complications none.  Discharge status alive and well.   Mr. [name] was tolerating a regular diet.  He has excellent strength in  the upper extremities with the exception of pre-existing weakness which  he has in his hands for quite some time.  His right hand incision is  clean, dry without signs of infection.  Cervical incision is the same.  He has had no problems with drainage from the wound or enlargement of  the wound since the drain was removed.  I will discharge him to home  this evening.  He is taking no pain medication and wants none  prescribed.  He will call for a return appointment with Dr. Venetia Maxon to  remove sutures sometime the week after next.           ______________________________  Coletta Memos, M.D.     KC/MEDQ  D:  12/18/2006  T:  12/19/2006  Job:  [id number]

## 2010-09-22 NOTE — Op Note (Signed)
Antonio Romero, Antonio Romero               ACCOUNT NO.:  [id number]   MEDICAL RECORD NO.:  [id number]          PATIENT TYPE:  INP   LOCATION:  3172                         FACILITY:  [facility]   PHYSICIAN:  Danae Orleans. Venetia Maxon, M.D.  DATE OF BIRTH:  1945-06-26   DATE OF PROCEDURE:  09/10/2008  DATE OF DISCHARGE:                               OPERATIVE REPORT   PREOPERATIVE DIAGNOSES:  Scoliosis, spondylosis, stenosis, degenerative  disk disease, radiculopathy in the thoracolumbar spine.   POSTOPERATIVE DIAGNOSES:  Scoliosis, spondylosis, stenosis, degenerative  disk disease, radiculopathy in the thoracolumbar spine.   PROCEDURES:  1. L1-S1 decompressive laminectomy with redo laminectomy, L4 through      sacrum.  2. Pedicle screw fixation, T10-S1 bilaterally with posterolateral      arthrodesis with bone morphogenic protein, autograft, equivocal      bone, and Actifuse.   SURGEON:  Danae Orleans. Venetia Maxon, MD   ASSISTANT:  Georgiann Cocker, RN   ANESTHESIA:  General endotracheal anesthesia.   ESTIMATED BLOOD LOSS:  3575 mL of blood with Cell Saver returned to the  patient of 1680 mL.   Total in's and out's for the surgery are 6171 in and 4135 out.   DISPOSITION:  Recovery.   INDICATIONS:  Antonio Romero is a 65 year old man with lumbar  spondylosis, stenosis, and scoliosis.  He previously undergone  decompressive laminectomy by another surgeon from L4-S1.  He has severe  biforaminal stenosis from L2 through sacral levels.  It was elected to  take him to surgery for decompression and fusion from T10-S1 levels.   PROCEDURE:  Antonio Romero was brought to the operating room.  Following  satisfactory and uncomplicated induction of general endotracheal  anesthesia and placement of intravenous lines and Foley catheter, the  patient was placed in a prone position on the [place] table.  His low  back was then shaved and prepped and draped in the usual sterile fashion  with Betadine scrub and paint.  An  incision was made from T10-S1,  carried through the previous incision scar tissue.  The transverse  processes of L1, L2, L3, L4, L5, and the sacral ala were exposed.  The  rib heads and transverse processes of T10, T11, and T12 were also  exposed.  Initially, after localizing radiograph demonstrated marker  probes from the T10-S1 levels, a total laminectomy at L1-L4 was  performed and the previous scar tissue from prior laminectomy of L5 was  also removed.  The thecal sac was then decompressed widely and  subsequently the pars interarticularis of L2, L3, L4, and L5 were  drilled up bilaterally and the inferior facets were removed at each of  these levels.  This was done in an effort to perform interbody grafting;  however, the dura was extremely densely adherent to ligamentous tissues  and when upon removing the lateral bony and ligamentous material, the  nerve roots were fairly immobile and it was not felt possible to  mobilize the thecal sac to be able to do interbody grafting.  Consequently, it was elected to thoroughly decompress the nerve roots  and then it was felt  that it would not be possible to place interbody  grafts and that portion of the procedure was not pursued.  The  posterolateral region was decorticated and pedicle screws were placed  from S1-T10 bilaterally using 45 x 6.5-mm screws at S1, 6.5 x 50-mm  screws from L2-L5, and then 5.5 x 50-mm screws from T10-L1.  All screws  had excellent purchase and their position was confirmed on AP and  lateral fluoroscopy, one screws had lateral cutout in an effort to place  it more medially, it was not unsuccessful, so it was elected to remove,  I did not place this, this was the T12 screw on the right.  The  remaining screws all had excellent positioning and no cutouts and their  position was confirmed on AP and lateral fluoroscopy.  A 300 mm rods  were then cut to fit and lordosed in the lumbar spine, kyphosed over the   thoracolumbar junction, and then locked down in situ.  The 2 transverse  connectors were placed, all screw heads were locked down in situ  appropriately.  Subsequently, posterolateral arthrodesis was then  performed with a large kit of BMP, divided into 6 strips, 3 of which  were laid on the right, 3 of which were laid on the left.  On the right,  this was supplemented by bone autograft, which had been run through a  bone mill and along with small amount of equivocal bone at the superior  and inferior aspects of the construct.  On the left, BMP was  supplemented with equivocal bone in the inferior aspect and 20 mL of  Actifuse along the middle and superior aspects.  A medium Hemovac drain  was placed and anchored with a nylon stitch.  The wound had been  extensively irrigated throughout the surgery.  The muscle layer was  reapproximated with #1 Vicryl sutures.  The subcutaneous tissue was  reapproximated with 2-0 Vicryl interrupted inverted sutures and skin  edges were reapproximated with 2-0 Vicryl subcuticular stitch.  The skin  edges were reapproximated with staples.  A sterile occlusive dressing  was placed.  The patient was extubated in the operating room, taken to  recovery room, in stable and satisfactory condition having tolerated the  operation well.  Counts were correct at the end of the case.      Danae Orleans. Venetia Maxon, M.D.  Electronically Signed     JDS/MEDQ  D:  09/10/2008  T:  09/11/2008  Job:  [id number]

## 2010-09-25 NOTE — Op Note (Signed)
NAMECLAYSON, [name]               ACCOUNT NO.:  [id number]   MEDICAL RECORD NO.:  [id number]          PATIENT TYPE:  IPS   LOCATION:  4032                         FACILITY:  [facility]   PHYSICIAN:  Danae Orleans. Venetia Maxon, M.D.  DATE OF BIRTH:  09/13/1945   DATE OF PROCEDURE:  DATE OF DISCHARGE:  10/01/2008                               OPERATIVE REPORT   ADDENDUM:  Dr. Trey Sailors was surgical assistant in the operation.   Posterolateral arthrodesis was performed from T10 through S1 levels  bilaterally.      Danae Orleans. Venetia Maxon, M.D.  Electronically Signed     JDS/MEDQ  D:  10/17/2008  T:  10/18/2008  Job:  [id number]

## 2010-09-25 NOTE — Cardiovascular Report (Signed)
   NAME:  Antonio Romero, Antonio Romero                        ACCOUNT NO.:  [id number]   MEDICAL RECORD NO.:  [id number]                   PATIENT TYPE:  INP   LOCATION:                                       FACILITY:  [facility]   PHYSICIAN:  Learta Codding, M.D.                 DATE OF BIRTH:  1946/03/11   DATE OF PROCEDURE:  DATE OF DISCHARGE:  04/09/2002                              CARDIAC CATHETERIZATION   REFERRING PHYSICIANS:  Dr. Molly Maduro ___________   CARDIOLOGIST:  Clarendon Bing, M.D.   PROCEDURES PERFORMED:  1. Left heart catheterization and selective coronary angiography.  2. Ventriculography.  3. Aortogram.   DIAGNOSIS:  Nonobstructive coronary artery disease.   INDICATION:  The patient is a 65 year old male with substernal chest pain.  He has been referred for cardiac catheterization to assess coronary anatomy.   DESCRIPTION OF PROCEDURE:  The right groin was sterilely prepped and draped.  A 6-French arterial sheath was used.  Then 6-French JL4 and JR4 catheters  were used for selective coronary angiography.  A 6-French angled pigtail  catheter was used for ventriculography as well as distal aortography.   FINDINGS:  HEMODYNAMICS:  Left ventricular pressure was 145/8 mmHg, aortic  pressure of 145/94 mmHg.   LEFT VENTRICULOGRAPHY:  Ejection fraction of 50%.  No segmental wall motion  abnormalities.  No mitral regurgitation.   SELECTIVE CORONARY ANGIOGRAPHY:  1. Left main coronary artery was a large-caliber vessel with no flow-     limiting disease.  2. Left anterior descending artery was a large-caliber vessel with a 40%     stenosis in the proximal portion.  The ramus also has a 50% stenosis but     it was a very small vessel.  3. The left circumflex coronary artery was a large-caliber vessel with no     evidence of flow-limiting disease.  4. Right coronary artery was a large-caliber vessel terminating at 2     posterolateral branches and a posterior descending artery.  There  was no     significant flow-limiting disease.  The first posterolateral branch has     approximately 50% stenosis.   DISTAL AORTOGRAPHY:  No evidence of aneurysm.   IMPRESSION AND PLAN:  Continue with medical therapy.  The patient is  hypertensive and we will increase Procardia XL to     60 mg p.o. every day.  Anticipate that he will be ready for discharge later today.                                              Learta Codding, M.D.   GED/MEDQ  D:  09/09/2002  T:  09/10/2002  Job:  [id number]

## 2010-09-25 NOTE — Discharge Summary (Signed)
NAMEPERCIVAL, GLASHEEN                           ACCOUNT NO.:  [id number]   MEDICAL RECORD NO.:  [id number]                   PATIENT TYPE:  INP   LOCATION:  2020                                 FACILITY:  [facility]   PHYSICIAN:  Learta Codding, M.D. [facility]             DATE OF BIRTH:  05-31-1945   DATE OF ADMISSION:  04/05/2002  DATE OF DISCHARGE:  04/09/2002                           DISCHARGE SUMMARY - REFERRING   BRIEF HISTORY:  The patient is a 65 year old white male who presented with  uncontrolled hypertension and intermittent chest discomfort.  The patient  has a history of hypertension that has been reportedly extensively worked up  for pheochromocytoma and renal artery stenosis.  The patient's daughter  states that the patient has not had any chest discomfort.  He does have a  history of hypertension, prior to his admission he was on Toprol XL 100 mg  q.d. and Benicar 40 mg q.d.   LABORATORY DATA:  TSH is 0.7.  UA was  unremarkable.  Fasting lipids showed  total cholesterol 171, triglycerides 116, HDL 40, LDL 108, CK's and  troponin's x3 were negative for myocardial infarction.  Hemoglobin A1c was  5.5.  Admission sodium 142, potassium 3.3, BUN 18, creatinine 1.6.  Normal  LFT's.  Albumin slightly low at 3.4.  Subsequent chemistries show correction  of his hypokalemia.  Plasminogen renin activity and aldosterone were pending  at the time of this dictation.  H&H is 14.6 and 41.5, normal indices,  platelets 162, WBC 6.5, PT 12.9, PTT 31, D-Dimer was slightly elevated at  0.55.  Chest x-ray did not show any active disease.  Cardiolite imaging  showed an EF of 41%,. Reversible defect involving the inferior wall.  EKG  showed normal sinus rhythm, normal axis, delayed R wave, nonspecific ST-T  wave changes, borderline LVH.   HOSPITAL COURSE:  The patient was admitted to the Unit 2000.  He was placed  on IV nitroglycerin for blood pressure and hypertension, as well as started  on aspirin,  Lovenox and Altace.  He was continued on his Toprol.  It was  noted on physical exam he had carotid bruits, Dopplers did not show any  significant plaque bilaterally.  He had bilateral vertebral antegrade flow.  Lower extremity Dopplers were also performed, there was no evidence of DVT,  SVT or Baker's cyst bilaterally.  Overnight he ruled out for myocardial  infarction, D-Dimer was slightly elevated thus a CT was ordered, and it was  felt that he should undergo stress testing.  CT scan was negative for D  doppler.  Echocardiogram was performed and this revealed an EF of 50-55%, no  wall motion abnormalities, diastolic function parameters were within normal  limits.  There is mild to moderate increased aortic valve thickness with  mild calcification and mild AI, small to moderate MR, left atrial  dilatation.  Adenosine Cardiolite was performed on 04/07/02, imaging  was  abnormal, thus it was felt that he should undergo a cardiac catheterization.  Dr. Andee Lineman noted on 04/08/02 his potassium was still low at 3.2, and glucose  was high at 176 and serum aldosterone was still pending.  He felt that we  should rule out hyperaldosteronism.  Cardiac catheterization was performed  on 04/09/02, by Dr. Andee Lineman, according to his progress note he has a 40%  proximal LAD, 50% ramus, 50% distal PL branch, EF was 50%.  Dr. Andee Lineman felt  that he had nonobstructive coronary artery disease and he should continue  risk factor modification, as well as control of his hypertension.  Post  sheath removal and bedrest, he was ambulating without difficulty,  catheterization site was intact.   DISPOSITION:  1. Medications:  He is discharged to home on Adalat CC 60 mg q.d., Altace 10     mg b.i.d., Pravachol 20 q.h.s. at bedtime, coated aspirin 325 mg q.d.,     Toprol XL 100 q.d., Maxzide 37.5/25 q.h.s.  He was instructed not to take     his Benicar.  He will bring all medications and his paper to office     visit.  2.  Activity:  He was advised no lifting, driving, sexual activity or heavy     exertion for two days, and given permission to return to work on     Thursday.  3. Diet:  He was asked to maintain low salt, low fat cholesterol diet.  4. Wound Care:  If he has any problems with his catheterization site he was     asked to call.  5. Followup:  He will need a BMP at his next office visit     to follow up on his potassium.  He will need liver function studies and     fasting lipids in approximately 6-8 weeks, since Pravachol was initiated.     He will see Dr. Marvel Plan P.A. on Monday 04/30/02 at 2:30 p.m.  At that     time follow up on the labs that were still pending at the time of     discharge should be investigated.  He will also need followup appointment     with Dr. Ines Bloomer.  At our office visit follow up catheterization if he     continues to do well consideration should be release his care to his     primary care M.D. to continue risk factor modification.     Joellyn Rued, P.A. [facility]                    Learta Codding, M.D. [facility]    EW/MEDQ  D:  04/09/2002  T:  04/09/2002  Job:  [id number]   cc:   [place] Bing, M.D. [facility]  [address]  [place]  [place] [address]  Fax: 1   Vickey Sages, M.D.  [place]  [place]

## 2010-09-25 NOTE — Discharge Summary (Signed)
   Antonio Romero, Antonio Romero                           ACCOUNT NO.:  [id number]   MEDICAL RECORD NO.:  [id number]                   PATIENT TYPE:  INP   LOCATION:  2020                                 FACILITY:  [facility]   PHYSICIAN:  Joellyn Rued, P.A. [facility]              DATE OF BIRTH:  1946/03/27   DATE OF ADMISSION:  04/05/2002  DATE OF DISCHARGE:                           DISCHARGE SUMMARY - REFERRING   ADDENDUM:   DISCHARGE DIAGNOSES:  1. Hypertension.  2. Nonobstructive coronary artery disease described by the cardiac     catheterization.  3. Hypokalemia.  4. Hyperglycemia with a negative hemoglobin A1c.   DISPOSITION:  As previously described.                                                 Joellyn Rued, P.A. [facility]    EW/MEDQ  D:  04/09/2002  T:  04/09/2002  Job:  [id number]

## 2010-09-25 NOTE — Discharge Summary (Signed)
Antonio Romero, Antonio Romero               ACCOUNT NO.:  [id number]   MEDICAL RECORD NO.:  [id number]          PATIENT TYPE:  INP   LOCATION:  3031                         FACILITY:  [facility]   PHYSICIAN:  Danae Orleans. Venetia Maxon, M.D.  DATE OF BIRTH:  10/31/1945   DATE OF ADMISSION:  09/10/2008  DATE OF DISCHARGE:  09/26/2008                               DISCHARGE SUMMARY   REASON FOR ADMISSION:  Idiopathic scoliosis, paralytic ileus, pulmonary  collapse, venous embolism thrombosis in deep vessels, paroxysmal  ventricular tachycardia, lumbar disk degeneration, tachypnea, anemia,  and hypertension.   FINAL DIAGNOSES:  1. Idiopathic scoliosis.  2. Paralytic ileus.  3. Pulmonary collapse.  4. 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  5. Paroxysmal ventricular tachycardia.  6. Lumbar disk degeneration.  7. Tachypnea.  8. Anemia.  9. Hypertension.   HOSPITAL COURSE:  Antonio Romero is a 65 year old man with thoracolumbar  scoliosis, spondylosis, degenerative disc disease, and lumbar  radiculopathy.  He underwent decompressive surgery at L1 through S1  levels with posterior fusion at T10 through S1 levels and did well with  the surgery, was observed in intensive care, did have significant blood  loss at the time of surgery at 3500 mL, of which 1680 mL of cell saver  blood was returned to the patient.  Postoperatively, the patient had an  ileus and was observed in the ICU.  He had a platelet count of 80,000.  He had a Hemovac drain, which was continued until Sep 14, 2008.  He was  mobilized slowly, had some tachycardia while on the floor with  hematocrit of 24.5.  The patient subsequently received a blood  transfusion of 2 units of blood with improvement in his vital signs and  energy.  He was, therefore, able to get up and ambulate.  The patient  was also noted to have calf pain and was found to have a deep venous  thrombosis in left lower extremity.  He was gradually mobilized,  required NG  drainage for ileus, and subsequently underwent a repeat  Doppler of his lower extremities, which showed bilateral DVT.  At that  point, it was elected to start him on Coumadin and he was continued on  heparin intravenously and gradually put on therapeutic Coumadin.  The  patient had a resolution of his ileus, was gradually mobilized, and it  was elected to transfer him to the inpatient rehabilitation unit.  While  on the floor, he also had a run of ventricular tachycardia, which was  self-limited without associated perturbations in vital signs.  The  patient was gradually mobilized, transferred to the rehabilitation  service with instructions to follow up in the office in 3 weeks  postoperatively.      Danae Orleans. Venetia Maxon, M.D.  Electronically Signed    JDS/MEDQ  D:  10/25/2008  T:  10/26/2008  Job:  [id number]

## 2010-09-25 NOTE — H&P (Signed)
NAMEVICTORINO, FATZINGER                           ACCOUNT NO.:  [id number]   MEDICAL RECORD NO.:  [id number]                   PATIENT TYPE:  INP   LOCATION:  2020                                 FACILITY:  [facility]   PHYSICIAN:  [place] Bing, M.D. [facility]           DATE OF BIRTH:  07/04/1945   DATE OF ADMISSION:  04/05/2002  DATE OF DISCHARGE:                                HISTORY & PHYSICAL   HISTORY OF PRESENT ILLNESS:  A 65 year old gentleman with no prior cardiac  history, presents with prolonged chest pain.  Mr. Mcfarren has been in  generally excellent health.  In recent weeks, control of hypertension has  been suboptimal despite multiple adjustments in his medical regimen.  This  prompted a change of physician.  He has been worked up for unusual causes of  hypertension including pheochromocytoma and renal disease, apparently with  negative results.  The family reports that he has been more fatigued than  usual, unable to perform physical activities that previously would not have  been a problem.  On the morning of admission, he was awakened with sharp  left chest discomfort that was localized and of moderate severity.  This  radiated through to the back.  There was no associated dyspnea nor pleuritic  component.  There was no diaphoresis nor nausea.  Chest pain persisted  throughout much of the day prompting transport by family to the Emergency  Department.  [facility] was selected since his daughter is a Engineer, civil (consulting) in the  [place].  He had persistent chest discomfort on arrival, but this resolved after  sublingual and intravenous nitroglycerin plus morphine.   PAST MEDICAL HISTORY:  Otherwise notable for  remote episode of  nephrolithiasis, has required basket extraction as well as a lumbar  laminectomy approximately five years ago.   CURRENT MEDICATIONS:  Include Toprol XL 100 mg q.d. and samples of Benicar.   SOCIAL HISTORY:  Denies use of alcohol or tobacco products.   FAMILY  HISTORY:  Positive for coronary disease in his father and a brother.   REVIEW OF SYSTEMS:  Nursing records reviewed and additional questioning  revealed all systems negative.   PHYSICAL EXAMINATION:  GENERAL:  Pleasant gentleman, in no acute distress.  VITAL SIGNS:  The initial blood pressure was 205/98; this subsequently  decreased to 160/90.  Heart rate 80 and regular, respirations 16.  Temperature 98.6.  HEENT:  Non-icteric sclerae.  NECK:  Transmitted murmur versus low pitch bruit over the left carotid; no  jugular venous distention.  ENDOCRINE:  No thyromegaly.  SKIN:  No significant lesions.  HEMATOPOIETIC :  No adenopathy.  CHEST:  Clear lung fields.  CARDIAC:  Grade 2/6 early peaking systolic ejection murmur with preservation  of the second heart sound.  ABDOMEN:  Soft and nontender; no organomegaly; no bruits; normal aortic  pulsation.  EXTREMITIES:  Normal distal pulses; 1+ edema on the right, 1/2+ edema on  the  left.  NEUROMUSCULAR:  Symmetric strength and tone.   EKG:  Normal sinus rhythm; minor ST-T wave abnormality inferiorly.  CHEST X-RAY:  NAD.   Other laboratory pending.   IMPRESSION:  Middle-aged gentleman with risk factors including family  history and hypertension, presents with somewhat atypical chest pain and no  striking EKG abnormalities.  Mr. Utter will be admitted with initial work  up to include serial EKGs, serial cardiac markers, and echocardiogram to  evaluate his murmur, which appears to represent aortic sclerosis or mild  aortic stenosis, carotid ultrasound to exclude significant atherosclerotic  disease and ultrasonography of the legs to exclude DVT.  D-dimer is pending  as well as the TSH.  If all this testing is unremarkable, a negative stress  Cardiolite study will likely be adequately reassuring to exclude significant  coronary disease.  These findings and the possible requirement for cardiac  catheterization were discussed extensively  with the patient and his family.                                               [place] Bing, M.D. [facility]    RR/MEDQ  D:  04/06/2002  T:  04/06/2002  Job:  [id number]

## 2011-02-16 LAB — CBC
HCT: 46.5
Hemoglobin: 15.9
MCHC: 34.2
MCV: 88.7
Platelets: 181
RBC: 5.24
RDW: 13.2
WBC: 7.3

## 2011-02-16 LAB — POCT I-STAT 7, (LYTES, BLD GAS, ICA,H+H)
Acid-base deficit: 7 — ABNORMAL HIGH
Bicarbonate: 18.3 — ABNORMAL LOW
Calcium, Ion: 1.12
HCT: 38 — ABNORMAL LOW
Hemoglobin: 12.9 — ABNORMAL LOW
O2 Saturation: 100
Operator id: 119881
Patient temperature: 35.5
Potassium: 3.3 — ABNORMAL LOW
Sodium: 135
TCO2: 19
pCO2 arterial: 32.6 — ABNORMAL LOW
pH, Arterial: 7.35
pO2, Arterial: 441 — ABNORMAL HIGH

## 2011-02-16 LAB — BASIC METABOLIC PANEL
BUN: 19
CO2: 30
Calcium: 10.2
Chloride: 103
Creatinine, Ser: 1.29
GFR calc Af Amer: >60
GFR calc non Af Amer: 57 — ABNORMAL LOW
Glucose, Bld: 97
Potassium: 5.4 — ABNORMAL HIGH
Sodium: 140

## 2011-02-16 LAB — POTASSIUM: Potassium: 4.4

## 2011-02-22 LAB — BASIC METABOLIC PANEL
BUN: 22
CO2: 27
Calcium: 10
Chloride: 106
Creatinine, Ser: 1.22
GFR calc Af Amer: >60
GFR calc non Af Amer: >60
Glucose, Bld: 104 — ABNORMAL HIGH
Potassium: 4.9
Sodium: 138

## 2011-02-22 LAB — CBC
HCT: 47.4
Hemoglobin: 16.1
MCHC: 33.9
MCV: 89.6
Platelets: 174
RBC: 5.3
RDW: 13.1
WBC: 8.1

## 2011-05-11 DIAGNOSIS — Z952 Presence of prosthetic heart valve: Secondary | ICD-10-CM

## 2011-05-11 HISTORY — PX: AORTIC VALVE REPLACEMENT: SHX41

## 2011-05-11 HISTORY — DX: Presence of prosthetic heart valve: Z95.2

## 2011-10-07 ENCOUNTER — Encounter: Payer: Self-pay | Admitting: Cardiology

## 2011-10-07 ENCOUNTER — Ambulatory Visit (INDEPENDENT_AMBULATORY_CARE_PROVIDER_SITE_OTHER): Payer: Medicare Other | Admitting: Cardiology

## 2011-10-07 ENCOUNTER — Other Ambulatory Visit: Payer: Self-pay | Admitting: Cardiology

## 2011-10-07 VITALS — BP 126/77 | HR 62 | Ht 67.0 in | Wt 211.0 lb

## 2011-10-07 DIAGNOSIS — G4733 Obstructive sleep apnea (adult) (pediatric): Secondary | ICD-10-CM | POA: Insufficient documentation

## 2011-10-07 DIAGNOSIS — Z136 Encounter for screening for cardiovascular disorders: Secondary | ICD-10-CM

## 2011-10-07 DIAGNOSIS — I359 Nonrheumatic aortic valve disorder, unspecified: Secondary | ICD-10-CM

## 2011-10-07 DIAGNOSIS — Z7901 Long term (current) use of anticoagulants: Secondary | ICD-10-CM | POA: Insufficient documentation

## 2011-10-07 DIAGNOSIS — O2230 Deep phlebothrombosis in pregnancy, unspecified trimester: Secondary | ICD-10-CM | POA: Insufficient documentation

## 2011-10-07 DIAGNOSIS — I350 Nonrheumatic aortic (valve) stenosis: Secondary | ICD-10-CM

## 2011-10-07 DIAGNOSIS — D689 Coagulation defect, unspecified: Secondary | ICD-10-CM

## 2011-10-07 DIAGNOSIS — I2510 Atherosclerotic heart disease of native coronary artery without angina pectoris: Secondary | ICD-10-CM | POA: Insufficient documentation

## 2011-10-07 DIAGNOSIS — I4891 Unspecified atrial fibrillation: Secondary | ICD-10-CM | POA: Insufficient documentation

## 2011-10-07 DIAGNOSIS — Q231 Congenital insufficiency of aortic valve: Secondary | ICD-10-CM

## 2011-10-07 NOTE — Assessment & Plan Note (Signed)
Clinical symptoms consistent with OSA but no formal diagnosis yet

## 2011-10-07 NOTE — Progress Notes (Signed)
Antonio Bottoms, MD, [facility] ABIM Board Certified in Adult Cardiovascular Medicine,Internal Medicine and Critical Care Medicine    CC: Evaluation of shortness of breath  HPI:  The patient is 66 year old male for performed cardiac catheterization on in 2003. He had no significant coronary artery disease at that time. He has been followed more recently by cardiologist in [facility] for aortic stenosis. Although no report was made of bicuspid aortic valve bedside echocardiogram today demonstrates that the patient likely has a bicuspid aortic valve. Last echocardiogram was in 2011 which showed a mean pressure gradient of 32 mm of mercury across the aortic valve. He had both transthoracic echocardiogram done and a TEE. He also has a history of atrial fibrillation and was cardioverted 2 years ago. The patient also reports significant insomnia and feels exhausted most of the time. He sleeps very little during the night. However he has daytime somnolence and doesn't feel refreshed. The patient and his wife main concern however are at the patient over the last several months has developed significant dyspnea on minimal exertion. He also becomes diaphoretic and after doing some minor work is totally exhausted. He denies any chest pain. He has no orthopnea PND palpitations or syncope.  PMH: reviewed and listed in Problem List in Electronic Records (and see below) Past Medical History  Diagnosis Date  . Coronary artery disease     Cardiac catheterization 2003 within normal limits ejection fraction 50%., Repeat cardiac catheterization 2011 10% left main stenosis 30% LAD 40-50% stenosis of the diagonal branch. No pressure recordings were given during his catheterization to assess aortic stenosis.  . Bicuspid aortic valve      moderate aortic stenosis 2011 mean pressure gradient 32 mm of mercury   . Chronic anticoagulation     Initiated by previous cardiologist CHADS2=1 only   . DVT (deep vein thrombosis) in  pregnancy     Recurrent DVT  . Coumadin resistance     Per patient history   Past Surgical History  Procedure Date  . Back surgery     Multiple procedures reported in the records    Allergies/SH/FHX : available in Electronic Records for review  Allergies  Allergen Reactions  . Nitroglycerin Swelling    Of lips and tongue   History   Social History  . Marital Status: Married    Spouse Name: N/A    Number of Children: N/A  . Years of Education: N/A   Occupational History  . Not on file.   Social History Main Topics  . Smoking status: Never Smoker   . Smokeless tobacco: Never Used  . Alcohol Use: Not on file  . Drug Use: Not on file  . Sexually Active: Not on file   Other Topics Concern  . Not on file   Social History Narrative  . No narrative on file   No family history on file.  Medications: Current Outpatient Prescriptions  Medication Sig Dispense Refill  . amLODipine (NORVASC) 10 MG tablet Take 1 tablet by mouth Daily.      . furosemide (LASIX) 40 MG tablet Take 1 tablet by mouth Daily.      . lansoprazole (PREVACID) 30 MG capsule Take 30 mg by mouth daily.      Marland Kitchen losartan (COZAAR) 100 MG tablet Take 1 tablet by mouth Daily.      . metoprolol succinate (TOPROL-XL) 100 MG 24 hr tablet Take 1 tablet by mouth Daily.      . potassium chloride SA (K-DUR,KLOR-CON) 20  MEQ tablet Take 1 tablet by mouth Daily.      Marland Kitchen PRADAXA 150 MG CAPS Take 1 capsule by mouth Twice daily.        ROS: No nausea or vomiting. No fever or chills.No melena or hematochezia.No bleeding.No claudication  Physical Exam: BP 126/77  Pulse 62  Ht 5\' 7"  (1.702 m)  Wt 211 lb (95.709 kg)  BMI 33.05 kg/m2  SpO2 96% General: Well-nourished muscular white male in no distress Neck: Pulses parvus and tardus bilaterally. No carotid bruits. No thyromegaly nonnodular thyroid. JVP is 6 cm Lungs: Clear breath sounds bilaterally Cardiac: Regular rate and rhythm with a harsh crescendo decrescendo with  late peaking systolic murmur at the right upper sternal border, louder at the left lower sternal border consistent with Galivardin sign, even with some radiation into the axilla. Murmur is also audible from the back. S2 is near absent S1 is within normal limits. There is no S3 Abdomen: Soft and nontender Vascular: 1+ peripheral pitting edema, normal distal pulses dorsalis pedis and posterior tibial pulses Skin: Warm and dry Physcologic: Normal affect Neurologic: Alert and oriented x3, grossly nonfocal  12lead ECG: Normal sinus rhythm with criteria for left ventricular hypertrophy both in the precordial leads as well as in lead 1 altered greater than 12 mm. Nonspecific ST-T wave changes and a single PVC noted on the 12-lead. Limited bedside ECHO: Morphologically the aortic valve appears to be bicuspid there is markedly decreased cusp excursion. No Doppler data could be obtained with the GE V scan. LV function appears to be normal in the 55-60% range with left ventricular hypertrophy. No definite mitral regurgitation No images are attached to the encounter.   Assessment and Plan  Atrial fibrillation Status post cardioversion 2 years ago has remained in normal sinus rhythm. No recurrent atrial fibrillation the patient require surgery he would be at high risk for postoperative atrial fibrillation, could consider perioperative amiodarone therapy  Obstructive sleep apnea Clinical symptoms consistent with OSA but no formal diagnosis yet  Bicuspid aortic valve Patient has symptomatic aortic stenosis and we will order a 2-D echocardiogram for assessment of aortic stenosis. I suspect the patient has a bicuspid aortic valve. Aortic stenosis severe the patient is symptomatic and will need surgery after diagnostic cardiac catheterization. The patient has given preference to go to [facility]  Chronic anticoagulation Patient has been anticoagulated for DVTs but also for atrial  fibrillation. He reports Coumadin resistance. I told him that if he needs a mechanical valve he will have to go back on Coumadin. I also gave him an article on Coumadin resistance to review and see if there were some compliance issues and also prior to this we may want to draw some factor levels to see the patient has a true Coumadin resistance.  Coronary artery disease Patient has mild coronary artery disease but will need a repeat cardiac catheterization prior to valve surgery if needed    Patient Active Problem List  Diagnoses  . Coronary artery disease  . Bicuspid aortic valve  . Chronic anticoagulation  . DVT (deep vein thrombosis) in pregnancy  . Coumadin resistance  . Atrial fibrillation  . Obstructive sleep apnea

## 2011-10-07 NOTE — Assessment & Plan Note (Addendum)
Status post cardioversion 2 years ago has remained in normal sinus rhythm. No recurrent atrial fibrillation the patient require surgery he would be at high risk for postoperative atrial fibrillation, could consider perioperative amiodarone therapy

## 2011-10-07 NOTE — Assessment & Plan Note (Signed)
Patient has been anticoagulated for DVTs but also for atrial fibrillation. He reports Coumadin resistance. I told him that if he needs a mechanical valve he will have to go back on Coumadin. I also gave him an article on Coumadin resistance to review and see if there were some compliance issues and also prior to this we may want to draw some factor levels to see the patient has a true Coumadin resistance.

## 2011-10-07 NOTE — Assessment & Plan Note (Signed)
Patient has symptomatic aortic stenosis and we will order a 2-D echocardiogram for assessment of aortic stenosis. I suspect the patient has a bicuspid aortic valve. Aortic stenosis severe the patient is symptomatic and will need surgery after diagnostic cardiac catheterization. The patient has given preference to go to [facility]

## 2011-10-07 NOTE — Patient Instructions (Addendum)
Continue all current medications.  Echo  Follow up will be based on test results from above.

## 2011-10-07 NOTE — Assessment & Plan Note (Signed)
Patient has mild coronary artery disease but will need a repeat cardiac catheterization prior to valve surgery if needed

## 2011-10-08 DIAGNOSIS — I359 Nonrheumatic aortic valve disorder, unspecified: Secondary | ICD-10-CM

## 2011-11-25 ENCOUNTER — Encounter: Payer: Self-pay | Admitting: Cardiology

## 2011-11-29 ENCOUNTER — Encounter: Payer: Self-pay | Admitting: Nurse Practitioner

## 2011-11-29 ENCOUNTER — Ambulatory Visit (INDEPENDENT_AMBULATORY_CARE_PROVIDER_SITE_OTHER): Payer: Medicare Other | Admitting: Nurse Practitioner

## 2011-11-29 VITALS — BP 175/96 | HR 80 | Ht 67.0 in | Wt 205.1 lb

## 2011-11-29 DIAGNOSIS — I359 Nonrheumatic aortic valve disorder, unspecified: Secondary | ICD-10-CM

## 2011-11-29 DIAGNOSIS — I350 Nonrheumatic aortic (valve) stenosis: Secondary | ICD-10-CM

## 2011-11-29 DIAGNOSIS — I4891 Unspecified atrial fibrillation: Secondary | ICD-10-CM

## 2011-11-29 NOTE — Progress Notes (Addendum)
Patient Name: Antonio Romero Date of Encounter: 11/29/2011  Primary Care Provider:  Christena Flake, MD Primary Cardiologist:  J. Hochrein, MD  Patient Profile  66 y/o male with recent AVR who presents for f/u.  Problem List   Past Medical History  Diagnosis Date  . Coronary artery disease     a. Cath 2003 nl cors;  b. Cath 2011 nonobs dzs;  c.  10/2011 Cath: LAD mod prox dzs, mild LCX & RCA dzs.  . Bicuspid aortic valve     a. 09/2011 Echo: EF 45-50%, Bicusp AoV with mild-mod AI and Sev AS. Mod-sev dil LA, mild-mod MR;  b. 10/2011 Bioprosthetic AVR - Carpentier-Edwards #25;  c. 10/2011 post-op echo EF 52%, AoV prosthesis w/ peak and mean gradietn, Gr2 DD.  Marland Kitchen DVT (deep venous thrombosis)     a. following back surgery in past.  . Coumadin resistance     a. pt reports labile INR in past.  . Aortic stenosis     a. s/p AVR 10/2011 as above  . PAF (paroxysmal atrial fibrillation)     a. s/p DCCV in 2011;  b. s/p Pulm Vein isolation (AtriCure) & LAA clipping (AtriClip) @ time of AVR 10/2011;  c. CHADS2=1  . HTN (hypertension)    Past Surgical History  Procedure Date  . Back surgery     Multiple procedures reported in the records    Allergies  Allergies  Allergen Reactions  . Nitroglycerin Swelling    Of lips and tongue    HPI  66 year old male with the above problem list.  He was seen in the clinic in May of this year with complaints of dyspnea on exertion.  Followup echocardiogram confirmed a bicuspid aortic valve and also severe aortic stenosis.  Patient then sought care at the [facility] and subsequently underwent successful bioprosthetic aortic valve replacement in mid June and also pulmonary vein isolation and clipping of the left atrial appendage.  He followed up with thoracic surgery in [place] earlier this month and was felt to be doing well.  In the setting of pulmonary vein isolation and left atrial appendage clipping, it has been recommended that he  discontinue his Pradaxa 3 months after surgery provided that he has not had any recurrent atrial fibrillation.  Patient reports that he's been doing well overall.  He notes that since his surgery he has noted occasional spells of weakness somewhat associated with hunger for which he'll sit down and eat something and then feel better.  He feels as though his appetite is somewhat less since his surgery and he is more likely to each small frequent meals than he was before.  He has been increasing his activity steadily and has had no recurrence of dyspnea and exertion.  To the best of his knowledge he has not had any fibrillation.  Home Medications  Prior to Admission medications   Medication Sig Start Date End Date Taking? Authorizing Provider  amLODipine (NORVASC) 10 MG tablet Take 1 tablet by mouth Daily. 10/07/11  Yes Historical Provider, MD  furosemide (LASIX) 40 MG tablet Take 1 tablet by mouth Daily. 10/07/11  Yes Historical Provider, MD  lansoprazole (PREVACID) 30 MG capsule Take 30 mg by mouth daily.   Yes Historical Provider, MD  losartan (COZAAR) 100 MG tablet Take 1 tablet by mouth Daily. 09/22/11  Yes Historical Provider, MD  metoprolol succinate (TOPROL-XL) 100 MG 24 hr tablet Take 1 tablet by mouth Daily. 10/07/11  Yes Historical Provider, MD  potassium chloride SA (K-DUR,KLOR-CON) 20 MEQ tablet Take 1 tablet by mouth Daily. 10/07/11  Yes Historical Provider, MD  PRADAXA 150 MG CAPS Take 1 capsule by mouth Twice daily. 10/07/11  Yes Historical Provider, MD    Review of Systems  Occasional spells of weakness associated with hunger as outlined above. All other systems reviewed and are otherwise negative except as noted above.  Physical Exam  Blood pressure 175/96, pulse 80, height 5\' 7"  (1.702 m), weight 205 lb 1.9 oz (93.042 kg).  He brought a list of blood pressures from home recordings and pressures are pretty consistently between 120 and 140 systolic with diastolics in the 70s to  80s. General: Pleasant, NAD Psych: Normal affect. Neuro: Alert and oriented X 3. Moves all extremities spontaneously. HEENT: Normal  Neck: Supple without bruits or JVD. Lungs:  Resp regular and unlabored, CTA. Heart: RRR no s3, s4.  Soft systolic ejection murmur noted at the right upper sternal border. Abdomen: Soft, non-tender, non-distended, BS + x 4.  Extremities: No clubbing, cyanosis.  Trace bilateral lower extremity edema. DP/PT/Radials 2+ and equal bilaterally.  Accessory Clinical Findings  ECG - sinus rhythm, 71, inferolateral T wave flattening and depression.  This was not present on preoperative EKG however we have no postoperative EKGs to compare to.  Assessment & Plan  1.  Bicuspid AoV/Severe Ao Stenosis:  S/p bioprosthetic AVR.  Overall, pt feels well postoperatively.  He is steadily increasing his activity level.  He has had no chest pain or dyspnea.  Post-op echo, performed @ [facility], showed normal functioning bioprosthesis.  2. PAF:  Pt is s/p DCCV in 2011 and from that time forward has been on pradaxa.  CHADS2=1 (htn).  In the setting of his recent surgery, he has also undergone LAA clipping and PV isolation.  He was on amio for a few wks post-op and has been advised to d/c pradaxa 3 mos post op (Sept), provided that he does not have any more afib, which he has not up to this point.  Pt would like to come off of anticoagulation as rec.  I reviewed the EXCLUDE trial, which was small and did show a 98% success in LAA exclusion using the AtriClip device.  This trial however did not look at stroke reduction as an endpoint.  We discussed that at this point, we don't know if coming off of Pradaxa is the right thing to do or not but given his low CHADS2 score to start out that if he wanted to d/c pradaxa in favor of aspirin, in Sept, it would be acceptable.  3.  HTN:  BP elevated today but his list from home shows relatively normal pressures.  He will continue to follow @ home and advise  [place] if his trend shifts to >140 consistently.  4.  Dispo:  Pt has seen Dr. Andee Lineman before but wishes to f/u with Dr. Antoine Poche here, in the [place] office.  F/u in 3 months.  Nicolasa Ducking, NP 11/29/2011, 12:39 PM

## 2011-11-29 NOTE — Patient Instructions (Signed)
Your physician recommends that you schedule a follow-up appointment in: 3 months with dr hochrein Your physician recommends that you continue on your current medications as directed. Please refer to the Current Medication list given to you today.

## 2012-03-27 ENCOUNTER — Encounter: Payer: Self-pay | Admitting: Cardiology

## 2012-03-27 ENCOUNTER — Ambulatory Visit (INDEPENDENT_AMBULATORY_CARE_PROVIDER_SITE_OTHER): Payer: Medicare Other | Admitting: Cardiology

## 2012-03-27 VITALS — BP 185/100 | HR 66 | Ht 67.0 in | Wt 215.4 lb

## 2012-03-27 DIAGNOSIS — G4733 Obstructive sleep apnea (adult) (pediatric): Secondary | ICD-10-CM

## 2012-03-27 DIAGNOSIS — Q231 Congenital insufficiency of aortic valve: Secondary | ICD-10-CM

## 2012-03-27 DIAGNOSIS — I4891 Unspecified atrial fibrillation: Secondary | ICD-10-CM

## 2012-03-27 DIAGNOSIS — I359 Nonrheumatic aortic valve disorder, unspecified: Secondary | ICD-10-CM

## 2012-03-27 DIAGNOSIS — I350 Nonrheumatic aortic (valve) stenosis: Secondary | ICD-10-CM

## 2012-03-27 NOTE — Patient Instructions (Addendum)
Please stop your Pradaxa.  Continue all other medications as listed.  Your physician has requested that you have an echocardiogram in June 2014. Echocardiography is a painless test that uses sound waves to create images of your heart. It provides your doctor with information about the size and shape of your heart and how well your heart's chambers and valves are working. This procedure takes approximately one hour. There are no restrictions for this procedure.  Please follow up with Dr Antoine Poche in June 2014 in the [place] office.  Please sign up for MyChart as soon as possible in order to receive future lab and diagnostic testing results

## 2012-03-27 NOTE — Progress Notes (Signed)
HPI The patient presents for evaluation of aortic valve replacement. This is his first appointment with me. He had bicuspid aortic valve and had this replaced in [place] this year. He's had a mildly reduced ejection fraction and minimal atherosclerotic coronary disease. He did have atrial fibrillation but he had pulmonary vein isolation, ligation of his atrial appendage then he has had no further dysrhythmias. He is still taking Pradaxa.  He says he is doing well. He walks sometimes on a treadmill. He does have some problems with his back and when his back hurts his blood pressure goes up. I reviewed a blood pressure diary and for the most part his blood pressure is well controlled. The patient denies any new symptoms such as chest discomfort, neck or arm discomfort. There has been no new shortness of breath, PND or orthopnea. There have been no reported palpitations, presyncope or syncope.   Allergies  Allergen Reactions  . Nitroglycerin Swelling    Of lips and tongue    Current Outpatient Prescriptions  Medication Sig Dispense Refill  . amLODipine (NORVASC) 10 MG tablet Take 1 tablet by mouth Daily.      . furosemide (LASIX) 40 MG tablet Take 1 tablet by mouth Daily.      . lansoprazole (PREVACID) 30 MG capsule Take 30 mg by mouth daily.      Marland Kitchen losartan (COZAAR) 100 MG tablet Take 1 tablet by mouth Daily.      . metoprolol succinate (TOPROL-XL) 100 MG 24 hr tablet Take 1 tablet by mouth Daily.      . potassium chloride SA (K-DUR,KLOR-CON) 20 MEQ tablet Take 1 tablet by mouth Daily.      Marland Kitchen PRADAXA 150 MG CAPS Take 1 capsule by mouth Twice daily.        Past Medical History  Diagnosis Date  . Coronary artery disease     a. Cath 2003 nl cors;  b. Cath 2011 nonobs dzs;  c.  10/2011 Cath: LAD mod prox dzs, mild LCX & RCA dzs.  . Bicuspid aortic valve     a. 09/2011 Echo: EF 45-50%, Bicusp AoV with mild-mod AI and Sev AS. Mod-sev dil LA, mild-mod MR;  b. 10/2011 Bioprosthetic  AVR - Carpentier-Edwards #25;  c. 10/2011 post-op echo EF 52%, AoV prosthesis w/ peak and mean gradietn, Gr2 DD.  Marland Kitchen DVT (deep venous thrombosis)     a. following back surgery in past.  . Coumadin resistance     a. pt reports labile INR in past.  . Aortic stenosis     a. s/p AVR 10/2011 as above  . PAF (paroxysmal atrial fibrillation)     a. s/p DCCV in 2011;  b. s/p Pulm Vein isolation (AtriCure) & LAA clipping (AtriClip) @ time of AVR 10/2011;  c. CHADS2=1  . HTN (hypertension)     Past Surgical History  Procedure Date  . Back surgery     Multiple procedures reported in the records   ROS:  As stated in the HPI and negative for all other systems.  PHYSICAL EXAM BP 185/100  Pulse 66  Ht 5\' 7"  (1.702 m)  Wt 215 lb 6.4 oz (97.705 kg)  BMI 33.74 kg/m2 GENERAL:  Well appearing HEENT:  Pupils equal round and reactive, fundi not visualized, oral mucosa unremarkable NECK:  No jugular venous distention, waveform within normal limits, carotid upstroke brisk and symmetric, no bruits, no thyromegaly LYMPHATICS:  No cervical, inguinal adenopathy LUNGS:  Clear to auscultation bilaterally  BACK:  No CVA tenderness CHEST:  Well healed sternotomy scar. HEART:  PMI not displaced or sustained,S1 and S2 within normal limits, no S3, no S4, no clicks, no rubs,  soft apical early peaking systolic murmur, no diastolic murmurs ABD:  Flat, positive bowel sounds normal in frequency in pitch, no bruits, no rebound, no guarding, no midline pulsatile mass, no hepatomegaly, no splenomegaly EXT:  2 plus pulses throughout,  mild bilateral lower extremity edema, no cyanosis no clubbing SKIN:  No rashes no nodules NEURO:  Cranial nerves II through XII grossly intact, motor grossly intact throughout PSYCH:  Cognitively intact, oriented to person place and time   EKG:  Sinus rhythm, rate 66, axis within normal limits, intervals within normal limits, no acute ST-T wave changes.  ASSESSMENT AND  PLAN  Bicuspid AoV/Severe Ao Stenosis:  S/p bioprosthetic AVR. Overall, pt feels well postoperatively. I will see him in June and Repeat echocardiogram prior to this.  PAF:  Given the fact he has had no recurrence of his arrhythmia, pulmonary vein isolation, atrial appendage occlusion is risk of embolic stroke is very low. Therefore, it is not justified continued use of Pradaxa.  We discussed this. He will stop his medications are low-dose aspirin.  HTN: BP elevated today but his list from home shows relatively normal pressures. We discussed therapeutic lifestyle changes to include salt restriction increased activity and mild weight loss. Otherwise no change in medications would be indicated.

## 2012-05-12 ENCOUNTER — Telehealth: Payer: Self-pay | Admitting: Cardiology

## 2012-05-12 NOTE — Telephone Encounter (Signed)
The patient has had no recent symptoms.  Therefore, based on ACC/AHA guidelines, the patient would be at acceptable risk for the planned procedure without further cardiovascular testing.

## 2012-05-12 NOTE — Telephone Encounter (Signed)
Will forward to dr/nurse 

## 2012-05-12 NOTE — Telephone Encounter (Signed)
New problem:   Need cardiac clearance for colonoscopy. Dr. Izola Price office phone  # [phone].

## 2012-05-15 NOTE — Telephone Encounter (Signed)
Fax number to Dr Izola Price office is [phone].  Clearance will be faxed to this number.

## 2012-10-16 ENCOUNTER — Encounter: Payer: Self-pay | Admitting: Cardiology

## 2012-10-16 ENCOUNTER — Ambulatory Visit (INDEPENDENT_AMBULATORY_CARE_PROVIDER_SITE_OTHER): Payer: Medicare Other | Admitting: Cardiology

## 2012-10-16 VITALS — BP 166/93 | HR 58 | Ht 67.0 in | Wt 210.0 lb

## 2012-10-16 DIAGNOSIS — I4891 Unspecified atrial fibrillation: Secondary | ICD-10-CM

## 2012-10-16 DIAGNOSIS — Q231 Congenital insufficiency of aortic valve: Secondary | ICD-10-CM

## 2012-10-16 DIAGNOSIS — I350 Nonrheumatic aortic (valve) stenosis: Secondary | ICD-10-CM

## 2012-10-16 DIAGNOSIS — I359 Nonrheumatic aortic valve disorder, unspecified: Secondary | ICD-10-CM

## 2012-10-16 NOTE — Progress Notes (Signed)
HPI The patient presents for evaluation of aortic valve replacement. He had bicuspid aortic valve and had this replaced in [place] this year. He's had a mildly reduced ejection fraction and minimal atherosclerotic coronary disease. He did have atrial fibrillation but he had pulmonary vein isolation, ligation of his atrial appendage then he has had no further dysrhythmias. At the last appointment I stopped Pradaxa.  He says he is doing well. He peddles a bicycle at times. The patient denies any new symptoms such as chest discomfort, neck or arm discomfort. There has been no new shortness of breath, PND or orthopnea. There have been no reported palpitations, presyncope or syncope.  Allergies  Allergen Reactions  . Nitroglycerin Swelling    Of lips and tongue    Current Outpatient Prescriptions  Medication Sig Dispense Refill  . amLODipine (NORVASC) 10 MG tablet Take 1 tablet by mouth Daily.      Marland Kitchen aspirin EC 81 MG tablet Take 81 mg by mouth daily.      . furosemide (LASIX) 40 MG tablet Take 1 tablet by mouth Daily.      . lansoprazole (PREVACID) 30 MG capsule Take 30 mg by mouth daily.      Marland Kitchen losartan (COZAAR) 100 MG tablet Take 1 tablet by mouth Daily.      . metoprolol succinate (TOPROL-XL) 100 MG 24 hr tablet Take 1 tablet by mouth Daily.      . potassium chloride SA (K-DUR,KLOR-CON) 20 MEQ tablet Take 1 tablet by mouth Daily.       No current facility-administered medications for this visit.    Past Medical History  Diagnosis Date  . Coronary artery disease     a. Cath 2003 nl cors;  b. Cath 2011 nonobs dzs;  c.  10/2011 Cath: LAD mod prox dzs, mild LCX & RCA dzs.  . Bicuspid aortic valve     a. 09/2011 Echo: EF 45-50%, Bicusp AoV with mild-mod AI and Sev AS. Mod-sev dil LA, mild-mod MR;  b. 10/2011 Bioprosthetic AVR - Carpentier-Edwards #25;  c. 10/2011 post-op echo EF 52%, AoV prosthesis w/ peak and mean gradietn, Gr2 DD.  Marland Kitchen DVT (deep venous  thrombosis)     a. following back surgery in past.  . Coumadin resistance     a. pt reports labile INR in past.  . Aortic stenosis     a. s/p AVR 10/2011 as above  . PAF (paroxysmal atrial fibrillation)     a. s/p DCCV in 2011;  b. s/p Pulm Vein isolation (AtriCure) & LAA clipping (AtriClip) @ time of AVR 10/2011;  c. CHADS2=1  . HTN (hypertension)     Past Surgical History  Procedure Laterality Date  . Back surgery      Multiple procedures reported in the records   ROS:  As stated in the HPI and negative for all other systems.  PHYSICAL EXAM BP 166/93  Pulse 58  Ht 5\' 7"  (1.702 m)  Wt 210 lb (95.255 kg)  BMI 32.88 kg/m2 GENERAL:  Well appearing HEENT:  Pupils equal round and reactive, fundi not visualized, oral mucosa unremarkable NECK:  No jugular venous distention, waveform within normal limits, carotid upstroke brisk and symmetric, no bruits, no thyromegaly LYMPHATICS:  No cervical, inguinal adenopathy LUNGS:  Clear to auscultation bilaterally BACK:  No CVA tenderness CHEST:  Well healed sternotomy scar. HEART:  PMI not displaced or sustained,S1 and S2 within normal limits, no S3, no S4, no clicks, no rubs,  soft apical early peaking systolic murmur, no diastolic murmurs ABD:  Flat, positive bowel sounds normal in frequency in pitch, no bruits, no rebound, no guarding, no midline pulsatile mass, no hepatomegaly, no splenomegaly EXT:  2 plus pulses throughout,  mild bilateral lower extremity edema, no cyanosis no clubbing SKIN:  No rashes no nodules NEURO:  Cranial nerves II through XII grossly intact, motor grossly intact throughout PSYCH:  Cognitively intact, oriented to person place and time   EKG:  Sinus rhythm, rate 66, axis within normal limits, intervals within normal limits, no acute ST-T wave changes.  ASSESSMENT AND PLAN  Bicuspid AoV/Severe Ao Stenosis:  He is doing well symptomatically with this. I will plan an echocardiogram. Of note he can have colonoscopy  which he had trouble scheduling in [place]  PAF:  He has had no symptomatic recurrence of this.  HTN: BP elevated today.   However, this has not been elevated at home and he has kept a watch on this. I will leave him on the meds as listed. We discussed losing weight to further manage his blood pressure.

## 2012-10-16 NOTE — Patient Instructions (Addendum)
Your physician recommends that you schedule a follow-up appointment in: 1 year. You will receive a reminder letter in the mail in about 10 months reminding you to call and schedule your appointment. If you don't receive this letter, please contact our office. Your physician recommends that you continue on your current medications as directed. Please refer to the Current Medication list given to you today. Your physician has requested that you have an echocardiogram. Echocardiography is a painless test that uses sound waves to create images of your heart. It provides your doctor with information about the size and shape of your heart and how well your heart's chambers and valves are working. This procedure takes approximately one hour. There are no restrictions for this procedure. The number for [facility] is [phone].

## 2012-10-18 ENCOUNTER — Other Ambulatory Visit (INDEPENDENT_AMBULATORY_CARE_PROVIDER_SITE_OTHER): Payer: Medicare Other

## 2012-10-18 ENCOUNTER — Other Ambulatory Visit: Payer: Self-pay

## 2012-10-18 DIAGNOSIS — I350 Nonrheumatic aortic (valve) stenosis: Secondary | ICD-10-CM

## 2012-10-18 DIAGNOSIS — Q231 Congenital insufficiency of aortic valve: Secondary | ICD-10-CM

## 2012-10-18 DIAGNOSIS — I359 Nonrheumatic aortic valve disorder, unspecified: Secondary | ICD-10-CM

## 2012-10-23 ENCOUNTER — Telehealth: Payer: Self-pay | Admitting: *Deleted

## 2012-10-23 NOTE — Telephone Encounter (Signed)
Patient informed and copy sent to PCP. 

## 2012-10-23 NOTE — Telephone Encounter (Signed)
Message copied by Eustace Moore on Mon Oct 23, 2012 10:20 AM ------      Message from: Rollene Rotunda      Created: Mon Oct 23, 2012  8:00 AM       Low normal EF.  Mild prosthetic valve aortic stenosis.  I will continue to follow clinically. No change in therapy.  Call Mr. Emond with the results and send results to [facility], [place]       ------

## 2013-07-16 ENCOUNTER — Other Ambulatory Visit (HOSPITAL_COMMUNITY): Payer: Self-pay | Admitting: Urology

## 2013-07-16 DIAGNOSIS — C61 Malignant neoplasm of prostate: Secondary | ICD-10-CM

## 2013-07-24 ENCOUNTER — Ambulatory Visit (HOSPITAL_COMMUNITY)
Admission: RE | Admit: 2013-07-24 | Discharge: 2013-07-24 | Disposition: A | Discharge status: Home / Self Care | Payer: Medicare Other | Source: Ambulatory Visit | Attending: Urology | Admitting: Urology

## 2013-07-24 DIAGNOSIS — C61 Malignant neoplasm of prostate: Secondary | ICD-10-CM | POA: Insufficient documentation

## 2013-07-24 DIAGNOSIS — Z981 Arthrodesis status: Secondary | ICD-10-CM | POA: Insufficient documentation

## 2013-07-24 MED ORDER — TECHNETIUM TC 99M MEDRONATE IV KIT
26.0000 | PACK | Freq: Once | INTRAVENOUS | Status: AC | PRN
  Administered 2013-07-24: 26 via INTRAVENOUS

## 2013-07-31 ENCOUNTER — Other Ambulatory Visit: Payer: Self-pay | Admitting: Urology

## 2013-08-09 ENCOUNTER — Encounter (HOSPITAL_COMMUNITY): Payer: Self-pay | Admitting: Pharmacy Technician

## 2013-08-15 NOTE — Patient Instructions (Addendum)
[facility]  08/15/2013                           YOUR PROCEDURE IS SCHEDULED ON: 08/24/13               PLEASE REPORT TO SHORT STAY CENTER AT :  10:15 AM               CALL THIS NUMBER IF ANY PROBLEMS THE DAY OF SURGERY :               [phone]                                REMEMBER:   Do not eat food or drink liquids AFTER MIDNIGHT   May have clear liquids UNTIL 6 HOURS BEFORE SURGERY (6:45 AM)               Take these medicines the morning of surgery with A SIP OF WATER: NORVASC / METOPROLOL / PREVACID   Do not wear jewelry, make-up   Do not wear lotions, powders, or perfumes.   Do not shave legs or underarms 12 hrs. before surgery (men may shave face)  Do not bring valuables to the hospital.  Contacts, dentures or bridgework may not be worn into surgery.  Leave suitcase in the car. After surgery it may be brought to your room.  For patients admitted to the hospital more than one night, checkout time is            11:00 AM                                                       The day of discharge.   Patients discharged the day of surgery will not be allowed to drive home.            If going home same day of surgery, must have someone stay with you              FIRST 24 hrs at home and arrange for some one to drive you              home from hospital.    Special Instructions             Please read over the following fact sheets that you were given:               1. [place]                                                X_____________________________________________________________________        Failure to follow these instructions may result in cancellation of your surgery

## 2013-08-16 ENCOUNTER — Encounter (HOSPITAL_COMMUNITY): Payer: Self-pay

## 2013-08-16 ENCOUNTER — Ambulatory Visit (HOSPITAL_COMMUNITY)
Admission: RE | Admit: 2013-08-16 | Discharge: 2013-08-16 | Disposition: A | Discharge status: Home / Self Care | Payer: Medicare Other | Source: Ambulatory Visit | Attending: Anesthesiology | Admitting: Anesthesiology

## 2013-08-16 ENCOUNTER — Encounter (HOSPITAL_COMMUNITY)
Admission: RE | Admit: 2013-08-16 | Discharge: 2013-08-16 | Disposition: A | Discharge status: Home / Self Care | Payer: Medicare Other | Source: Ambulatory Visit | Attending: Urology | Admitting: Urology

## 2013-08-16 DIAGNOSIS — Z01810 Encounter for preprocedural cardiovascular examination: Secondary | ICD-10-CM | POA: Insufficient documentation

## 2013-08-16 DIAGNOSIS — Z01818 Encounter for other preprocedural examination: Secondary | ICD-10-CM | POA: Insufficient documentation

## 2013-08-16 DIAGNOSIS — C61 Malignant neoplasm of prostate: Secondary | ICD-10-CM | POA: Insufficient documentation

## 2013-08-16 HISTORY — DX: Presence of prosthetic heart valve: Z95.2

## 2013-08-16 HISTORY — DX: Personal history of urinary calculi: Z87.442

## 2013-08-16 HISTORY — DX: Adverse effect of unspecified anesthetic, initial encounter: T41.45XA

## 2013-08-16 HISTORY — DX: Other complications of anesthesia, initial encounter: T88.59XA

## 2013-08-16 HISTORY — DX: Cardiac arrhythmia, unspecified: I49.9

## 2013-08-16 HISTORY — DX: Malignant neoplasm of prostate: C61

## 2013-08-16 LAB — CBC
HCT: 45.1 % (ref 39.0–52.0)
Hemoglobin: 15.5 g/dL (ref 13.0–17.0)
MCH: 30.6 pg (ref 26.0–34.0)
MCHC: 34.4 g/dL (ref 30.0–36.0)
MCV: 89.1 fL (ref 78.0–100.0)
Platelets: 146 10*3/uL — ABNORMAL LOW (ref 150–400)
RBC: 5.06 MIL/uL (ref 4.22–5.81)
RDW: 13.3 % (ref 11.5–15.5)
WBC: 6 10*3/uL (ref 4.0–10.5)

## 2013-08-16 LAB — BASIC METABOLIC PANEL
BUN: 22 mg/dL (ref 6–23)
CO2: 24 mEq/L (ref 19–32)
Calcium: 10 mg/dL (ref 8.4–10.5)
Chloride: 103 mEq/L (ref 96–112)
Creatinine, Ser: 1.19 mg/dL (ref 0.50–1.35)
GFR calc Af Amer: 71 mL/min — ABNORMAL LOW (ref >90)
GFR calc non Af Amer: 61 mL/min — ABNORMAL LOW (ref >90)
Glucose, Bld: 159 mg/dL — ABNORMAL HIGH (ref 70–99)
Potassium: 4.7 mEq/L (ref 3.7–5.3)
Sodium: 142 mEq/L (ref 137–147)

## 2013-08-16 NOTE — Progress Notes (Signed)
08/16/13 1257  OBSTRUCTIVE SLEEP APNEA  Have you ever been diagnosed with sleep apnea through a sleep study? No  Do you snore loudly (loud enough to be heard through closed doors)?  1  Do you often feel tired, fatigued, or sleepy during the daytime? 0  Has anyone observed you stop breathing during your sleep? 1  Do you have, or are you being treated for high blood pressure? 1  BMI more than 35 kg/m2? 0  Age over 68 years old? 1  Neck circumference greater than 40 cm/18 inches? 0  Gender: 1  Obstructive Sleep Apnea Score 5  Score 4 or greater  Results sent to PCP

## 2013-08-17 ENCOUNTER — Telehealth: Payer: Self-pay | Admitting: Cardiology

## 2013-08-17 NOTE — Telephone Encounter (Signed)
Left detailed message for Antonio Romero that neither Dr. Percival Spanish nor his primary nurse are in the office today. I will look for documentation of surgical clearance but that we may not be able to help with that until next week.

## 2013-08-17 NOTE — Telephone Encounter (Signed)
New message  Antonio Romero needs answer today on surgical clearance. Please call and advise.

## 2013-08-20 NOTE — Telephone Encounter (Signed)
Surgical clearance request given to Dr Percival Spanish

## 2013-08-20 NOTE — Telephone Encounter (Signed)
Georgina Peer, LPN requested surgical clearance be faxed to their office.  Paperwork taken to MR to be faxed.

## 2013-08-21 ENCOUNTER — Ambulatory Visit (INDEPENDENT_AMBULATORY_CARE_PROVIDER_SITE_OTHER): Payer: Medicare Other | Admitting: Cardiology

## 2013-08-21 ENCOUNTER — Encounter: Payer: Self-pay | Admitting: Cardiology

## 2013-08-21 VITALS — BP 150/88 | HR 60 | Ht 67.0 in | Wt 214.8 lb

## 2013-08-21 DIAGNOSIS — I2510 Atherosclerotic heart disease of native coronary artery without angina pectoris: Secondary | ICD-10-CM

## 2013-08-21 DIAGNOSIS — Z01810 Encounter for preprocedural cardiovascular examination: Secondary | ICD-10-CM

## 2013-08-21 DIAGNOSIS — Z954 Presence of other heart-valve replacement: Secondary | ICD-10-CM

## 2013-08-21 DIAGNOSIS — Z952 Presence of prosthetic heart valve: Secondary | ICD-10-CM

## 2013-08-21 NOTE — Progress Notes (Signed)
Clinical Summary Mr. Antonio Romero is a 68 y.o.male last seen by Dr Percival Spanish, this is our first visit together. He is seen for the following medical problems.  1. Bicuspid AV - previous bioprosthetic AVR in [place], [place] in 2014 - denies any SOB, no DOE. No presyncope or syncope  2. Afib - pulm vein isolation with ligation of atrial appendage during recent surgery - he has had no detected recurrences, and pradaxa was stopped by Dr Percival Spanish previously - denies any recent palpitations  3. HTN - checks bp at home a few times a week. Typically around 130s/70s - compliant with meds   4. Preoperative evaluation - patient being considered for robotic prostatectomy - denies any limitations regarding cardiopulm capacity  Past Medical History  Diagnosis Date  . Coronary artery disease     a. Cath 2003 nl cors;  b. Cath 2011 nonobs dzs;  c.  10/2011 Cath: LAD mod prox dzs, mild LCX & RCA dzs.  . Bicuspid aortic valve     a. 09/2011 Echo: EF 45-50%, Bicusp AoV with mild-mod AI and Sev AS. Mod-sev dil LA, mild-mod MR;  b. 10/2011 Bioprosthetic AVR - Carpentier-Edwards #25;  c. 10/2011 post-op echo EF 52%, AoV prosthesis w/ 62mmHg peak and 18mmHg mean gradietn, Gr2 DD.  Marland Kitchen DVT (deep venous thrombosis)     a. following back surgery in past.  . Coumadin resistance     a. pt reports labile INR in past.  . PAF (paroxysmal atrial fibrillation)     a. s/p DCCV in 2011;  b. s/p Pulm Vein isolation (AtriCure) & LAA clipping (AtriClip) @ time of AVR 10/2011;  c. CHADS2=1  . HTN (hypertension)   . Complication of anesthesia     NAUSEA  . H/O aortic valve replacement 2013  . Dysrhythmia     HX A FIB - ABLATION 2013 - RESOLVED  . Prostate cancer   . History of kidney stones      Allergies  Allergen Reactions  . Nitroglycerin Swelling    Of lips and tongue     Current Outpatient Prescriptions  Medication Sig Dispense Refill  . amLODipine (NORVASC) 10 MG tablet Take 1 tablet by mouth every  morning.       Marland Kitchen aspirin EC 81 MG tablet Take 81 mg by mouth daily.      . furosemide (LASIX) 40 MG tablet Take 1 tablet by mouth every morning.       . lansoprazole (PREVACID) 30 MG capsule Take 30 mg by mouth daily.      Marland Kitchen losartan (COZAAR) 100 MG tablet Take 1 tablet by mouth every morning.       . metoprolol succinate (TOPROL-XL) 100 MG 24 hr tablet Take 1 tablet by mouth every morning.       . potassium chloride SA (K-DUR,KLOR-CON) 20 MEQ tablet Take 1 tablet by mouth Daily.       No current facility-administered medications for this visit.     Past Surgical History  Procedure Laterality Date  . Aortic valve replacement  2013  . Back surgery      Multiple procedures reported in the records     Allergies  Allergen Reactions  . Nitroglycerin Swelling    Of lips and tongue      No family history on file.   Social History Mr. Burlingame reports that he has never smoked. He has never used smokeless tobacco. Mr. Yeagle reports that he does not drink alcohol.   Review  of Systems CONSTITUTIONAL: No weight loss, fever, chills, weakness or fatigue.  HEENT: Eyes: No visual loss, blurred vision, double vision or yellow sclerae.No hearing loss, sneezing, congestion, runny nose or sore throat.  SKIN: No rash or itching.  CARDIOVASCULAR: per HPI RESPIRATORY: No shortness of breath, cough or sputum.  GASTROINTESTINAL: No anorexia, nausea, vomiting or diarrhea. No abdominal pain or blood.  GENITOURINARY: No burning on urination, no polyuria NEUROLOGICAL: No headache, dizziness, syncope, paralysis, ataxia, numbness or tingling in the extremities. No change in bowel or bladder control.  MUSCULOSKELETAL: No muscle, back pain, joint pain or stiffness.  LYMPHATICS: No enlarged nodes. No history of splenectomy.  PSYCHIATRIC: No history of depression or anxiety.  ENDOCRINOLOGIC: No reports of sweating, cold or heat intolerance. No polyuria or polydipsia.  Marland Kitchen   Physical Examination p 60  bp 150/88 Wt 214 lbs BMI 34 Gen: resting comfortably, no acute distress HEENT: no scleral icterus, pupils equal round and reactive, no palptable cervical adenopathy,  CV: RRR, 2/6 systolic murmur RUSB, no JVD Resp: Clear to auscultation bilaterally GI: abdomen is soft, non-tender, non-distended, normal bowel sounds, no hepatosplenomegaly MSK: extremities are warm, no edema.  Skin: warm, no rash Neuro:  no focal deficits Psych: appropriate affect   Diagnostic Studies 08/21/13 Clinic EKG NSR, LVH, LAE    Assessment and Plan  1. Hx of bicuspid AV - prior bioprosthetic AVR, denies any current symptoms - continue to follow clinically  2. Afib - prior pulm vein isolation with LA appendage ligation, has had no detected recurrences or symptoms. Has been taken off anticoag - continue to follow clinically  3. HTN - elevated in clinic, home numbers are at goal - continue current meds   4. Preoperative evaluation - patient being considered for urological procedure. He has no active acute cardiac conditions. He tolerates >4 METS on a regular basis without limitation. Recommend proceed with procedure planned, no further cardiac testing of intervention is planned at this time. Continue cardic meds in the periop period.     Patient will resume regular care with Dr Percival Spanish at next appt      Arnoldo Lenis, M.D., F.A.C.C.

## 2013-08-21 NOTE — Patient Instructions (Signed)
Keep your follow up appointment with Dr. Percival Spanish on 10-22-2013.  Your physician recommends that you continue on your current medications as directed. Please refer to the Current Medication list given to you today.

## 2013-08-21 NOTE — Telephone Encounter (Signed)
The patient has an appt with Dr. Harl Bowie today.

## 2013-08-23 MED ORDER — GENTAMICIN SULFATE 40 MG/ML IJ SOLN
390.0000 mg | INTRAVENOUS | Status: AC
  Administered 2013-08-24: 390 mg via INTRAVENOUS
  Filled 2013-08-23: qty 9.75

## 2013-08-23 MED ORDER — GENTAMICIN SULFATE 40 MG/ML IJ SOLN
5.0000 mg/kg | INTRAVENOUS | Status: DC
  Filled 2013-08-23: qty 12.25

## 2013-08-24 ENCOUNTER — Inpatient Hospital Stay (HOSPITAL_COMMUNITY): Payer: Medicare Other | Admitting: *Deleted

## 2013-08-24 ENCOUNTER — Encounter (HOSPITAL_COMMUNITY): Payer: Self-pay | Admitting: *Deleted

## 2013-08-24 ENCOUNTER — Encounter (HOSPITAL_COMMUNITY)
Admission: RE | Disposition: A | Discharge status: Home / Self Care | Payer: Self-pay | Source: Ambulatory Visit | Attending: Urology

## 2013-08-24 ENCOUNTER — Encounter (HOSPITAL_COMMUNITY): Payer: Medicare Other | Admitting: *Deleted

## 2013-08-24 ENCOUNTER — Inpatient Hospital Stay (HOSPITAL_COMMUNITY)
Admission: RE | Admit: 2013-08-24 | Discharge: 2013-08-25 | DRG: 708 | Disposition: A | Discharge status: Home / Self Care | Payer: Medicare Other | Source: Ambulatory Visit | Attending: Urology | Admitting: Urology

## 2013-08-24 DIAGNOSIS — I10 Essential (primary) hypertension: Secondary | ICD-10-CM | POA: Diagnosis present

## 2013-08-24 DIAGNOSIS — I2510 Atherosclerotic heart disease of native coronary artery without angina pectoris: Secondary | ICD-10-CM | POA: Diagnosis present

## 2013-08-24 DIAGNOSIS — Z87442 Personal history of urinary calculi: Secondary | ICD-10-CM

## 2013-08-24 DIAGNOSIS — Z952 Presence of prosthetic heart valve: Secondary | ICD-10-CM

## 2013-08-24 DIAGNOSIS — Z86718 Personal history of other venous thrombosis and embolism: Secondary | ICD-10-CM

## 2013-08-24 DIAGNOSIS — I4891 Unspecified atrial fibrillation: Secondary | ICD-10-CM | POA: Diagnosis present

## 2013-08-24 DIAGNOSIS — C61 Malignant neoplasm of prostate: Principal | ICD-10-CM | POA: Diagnosis present

## 2013-08-24 HISTORY — PX: LYMPHADENECTOMY: SHX5960

## 2013-08-24 HISTORY — PX: ROBOT ASSISTED LAPAROSCOPIC RADICAL PROSTATECTOMY: SHX5141

## 2013-08-24 LAB — HEMOGLOBIN AND HEMATOCRIT, BLOOD
HCT: 40.9 % (ref 39.0–52.0)
Hemoglobin: 14 g/dL (ref 13.0–17.0)

## 2013-08-24 LAB — BASIC METABOLIC PANEL WITH GFR
BUN: 21 mg/dL (ref 6–23)
CO2: 26 meq/L (ref 19–32)
Calcium: 9.2 mg/dL (ref 8.4–10.5)
Chloride: 101 meq/L (ref 96–112)
Creatinine, Ser: 1.11 mg/dL (ref 0.50–1.35)
GFR calc Af Amer: 77 mL/min — ABNORMAL LOW
GFR calc non Af Amer: 67 mL/min — ABNORMAL LOW
Glucose, Bld: 182 mg/dL — ABNORMAL HIGH (ref 70–99)
Potassium: 4.1 meq/L (ref 3.7–5.3)
Sodium: 139 meq/L (ref 137–147)

## 2013-08-24 LAB — ABO/RH: ABO/RH(D): A POS

## 2013-08-24 LAB — TYPE AND SCREEN
ABO/RH(D): A POS
Antibody Screen: NEGATIVE

## 2013-08-24 SURGERY — ROBOTIC ASSISTED LAPAROSCOPIC RADICAL PROSTATECTOMY
Anesthesia: General

## 2013-08-24 MED ORDER — PANTOPRAZOLE SODIUM 40 MG PO TBEC
40.0000 mg | DELAYED_RELEASE_TABLET | Freq: Every day | ORAL | Status: DC
Start: 2013-08-25 — End: 2013-08-25
  Administered 2013-08-25: 40 mg via ORAL
  Filled 2013-08-24: qty 1

## 2013-08-24 MED ORDER — BUPIVACAINE LIPOSOME 1.3 % IJ SUSP
20.0000 mL | Freq: Once | INTRAMUSCULAR | Status: DC
  Filled 2013-08-24: qty 20

## 2013-08-24 MED ORDER — NEOSTIGMINE METHYLSULFATE 1 MG/ML IJ SOLN
INTRAMUSCULAR | Status: DC | PRN
  Administered 2013-08-24: 4 mg via INTRAVENOUS

## 2013-08-24 MED ORDER — MIDAZOLAM HCL 2 MG/2ML IJ SOLN
INTRAMUSCULAR | Status: AC
  Filled 2013-08-24: qty 2

## 2013-08-24 MED ORDER — SENNA 8.6 MG PO TABS
1.0000 | ORAL_TABLET | Freq: Two times a day (BID) | ORAL | Status: DC
  Administered 2013-08-24 – 2013-08-25 (×2): 8.6 mg via ORAL
  Filled 2013-08-24 (×2): qty 1

## 2013-08-24 MED ORDER — PROPOFOL 10 MG/ML IV BOLUS
INTRAVENOUS | Status: AC
  Filled 2013-08-24: qty 20

## 2013-08-24 MED ORDER — LACTATED RINGERS IR SOLN
Status: DC | PRN
  Administered 2013-08-24: 1000 mL

## 2013-08-24 MED ORDER — EPHEDRINE SULFATE 50 MG/ML IJ SOLN
INTRAMUSCULAR | Status: DC | PRN
  Administered 2013-08-24 (×3): 5 mg via INTRAVENOUS

## 2013-08-24 MED ORDER — METOPROLOL SUCCINATE ER 100 MG PO TB24
100.0000 mg | ORAL_TABLET | Freq: Every morning | ORAL | Status: DC
  Administered 2013-08-25: 100 mg via ORAL
  Filled 2013-08-24: qty 1

## 2013-08-24 MED ORDER — HYDRALAZINE HCL 20 MG/ML IJ SOLN
INTRAMUSCULAR | Status: DC | PRN
  Administered 2013-08-24: 5 mg via INTRAVENOUS

## 2013-08-24 MED ORDER — HEPARIN SODIUM (PORCINE) 5000 UNIT/ML IJ SOLN
5000.0000 [IU] | Freq: Three times a day (TID) | INTRAMUSCULAR | Status: DC
  Administered 2013-08-24 – 2013-08-25 (×3): 5000 [IU] via SUBCUTANEOUS
  Filled 2013-08-24 (×7): qty 1

## 2013-08-24 MED ORDER — CEFAZOLIN SODIUM-DEXTROSE 2-3 GM-% IV SOLR
2.0000 g | INTRAVENOUS | Status: AC
  Administered 2013-08-24: 2 g via INTRAVENOUS

## 2013-08-24 MED ORDER — FUROSEMIDE 40 MG PO TABS
40.0000 mg | ORAL_TABLET | Freq: Every morning | ORAL | Status: DC
  Administered 2013-08-25: 40 mg via ORAL
  Filled 2013-08-24: qty 1

## 2013-08-24 MED ORDER — SODIUM CHLORIDE 0.9 % IJ SOLN
INTRAMUSCULAR | Status: AC
  Filled 2013-08-24: qty 10

## 2013-08-24 MED ORDER — SCOPOLAMINE 1 MG/3DAYS TD PT72
MEDICATED_PATCH | TRANSDERMAL | Status: DC | PRN
  Administered 2013-08-24: 1 via TRANSDERMAL

## 2013-08-24 MED ORDER — LIDOCAINE HCL (CARDIAC) 20 MG/ML IV SOLN
INTRAVENOUS | Status: DC | PRN
  Administered 2013-08-24: 50 mg via INTRAVENOUS

## 2013-08-24 MED ORDER — DEXAMETHASONE SODIUM PHOSPHATE 10 MG/ML IJ SOLN
INTRAMUSCULAR | Status: DC | PRN
  Administered 2013-08-24: 5 mg via INTRAVENOUS

## 2013-08-24 MED ORDER — ONDANSETRON HCL 4 MG/2ML IJ SOLN
INTRAMUSCULAR | Status: DC | PRN
  Administered 2013-08-24: 4 mg via INTRAVENOUS

## 2013-08-24 MED ORDER — POTASSIUM CHLORIDE CRYS ER 20 MEQ PO TBCR
20.0000 meq | EXTENDED_RELEASE_TABLET | Freq: Every day | ORAL | Status: DC
  Administered 2013-08-24 – 2013-08-25 (×2): 20 meq via ORAL
  Filled 2013-08-24 (×2): qty 1

## 2013-08-24 MED ORDER — AMLODIPINE BESYLATE 10 MG PO TABS
10.0000 mg | ORAL_TABLET | Freq: Every morning | ORAL | Status: DC
  Administered 2013-08-25: 10 mg via ORAL
  Filled 2013-08-24: qty 1

## 2013-08-24 MED ORDER — HYDROMORPHONE HCL PF 1 MG/ML IJ SOLN: 0.5000 mg | INTRAMUSCULAR | Status: DC | PRN

## 2013-08-24 MED ORDER — PROPOFOL 10 MG/ML IV BOLUS
INTRAVENOUS | Status: DC | PRN
  Administered 2013-08-24: 200 mg via INTRAVENOUS

## 2013-08-24 MED ORDER — LACTATED RINGERS IV SOLN: INTRAVENOUS | Status: DC

## 2013-08-24 MED ORDER — DOCUSATE SODIUM 100 MG PO CAPS
100.0000 mg | ORAL_CAPSULE | Freq: Two times a day (BID) | ORAL | Status: DC
  Administered 2013-08-24 – 2013-08-25 (×2): 100 mg via ORAL
  Filled 2013-08-24 (×3): qty 1

## 2013-08-24 MED ORDER — ROCURONIUM BROMIDE 100 MG/10ML IV SOLN
INTRAVENOUS | Status: DC | PRN
  Administered 2013-08-24: 50 mg via INTRAVENOUS

## 2013-08-24 MED ORDER — HYDROMORPHONE HCL PF 1 MG/ML IJ SOLN
INTRAMUSCULAR | Status: AC
  Filled 2013-08-24: qty 1

## 2013-08-24 MED ORDER — ONDANSETRON HCL 4 MG/2ML IJ SOLN: 4.0000 mg | INTRAMUSCULAR | Status: DC | PRN

## 2013-08-24 MED ORDER — NEOSTIGMINE METHYLSULFATE 1 MG/ML IJ SOLN
INTRAMUSCULAR | Status: AC
  Filled 2013-08-24: qty 10

## 2013-08-24 MED ORDER — METOCLOPRAMIDE HCL 5 MG/ML IJ SOLN
INTRAMUSCULAR | Status: AC
  Filled 2013-08-24: qty 2

## 2013-08-24 MED ORDER — MIDAZOLAM HCL 5 MG/5ML IJ SOLN
INTRAMUSCULAR | Status: DC | PRN
  Administered 2013-08-24: 2 mg via INTRAVENOUS

## 2013-08-24 MED ORDER — CISATRACURIUM BESYLATE (PF) 10 MG/5ML IV SOLN
INTRAVENOUS | Status: DC | PRN
  Administered 2013-08-24: 5 mg via INTRAVENOUS
  Administered 2013-08-24: 4 mg via INTRAVENOUS

## 2013-08-24 MED ORDER — ACETAMINOPHEN 500 MG PO TABS
1000.0000 mg | ORAL_TABLET | Freq: Four times a day (QID) | ORAL | Status: DC
  Administered 2013-08-24 (×2): 1000 mg via ORAL
  Filled 2013-08-24 (×4): qty 2

## 2013-08-24 MED ORDER — EPHEDRINE SULFATE 50 MG/ML IJ SOLN
INTRAMUSCULAR | Status: AC
  Filled 2013-08-24: qty 1

## 2013-08-24 MED ORDER — SCOPOLAMINE 1 MG/3DAYS TD PT72
MEDICATED_PATCH | TRANSDERMAL | Status: AC
  Filled 2013-08-24: qty 1

## 2013-08-24 MED ORDER — ROCURONIUM BROMIDE 100 MG/10ML IV SOLN
INTRAVENOUS | Status: AC
  Filled 2013-08-24: qty 1

## 2013-08-24 MED ORDER — OXYCODONE HCL 5 MG PO TABS
5.0000 mg | ORAL_TABLET | ORAL | Status: DC | PRN
  Administered 2013-08-24: 5 mg via ORAL
  Filled 2013-08-24: qty 1

## 2013-08-24 MED ORDER — GLYCOPYRROLATE 0.2 MG/ML IJ SOLN
INTRAMUSCULAR | Status: DC | PRN
  Administered 2013-08-24: 0.6 mg via INTRAVENOUS
  Administered 2013-08-24: 0.2 mg via INTRAVENOUS

## 2013-08-24 MED ORDER — HYDROMORPHONE HCL PF 2 MG/ML IJ SOLN
INTRAMUSCULAR | Status: AC
  Filled 2013-08-24: qty 1

## 2013-08-24 MED ORDER — HYDROMORPHONE HCL PF 1 MG/ML IJ SOLN
INTRAMUSCULAR | Status: DC | PRN
Start: 2013-08-24 — End: 2013-08-24
  Administered 2013-08-24 (×4): 0.5 mg via INTRAVENOUS

## 2013-08-24 MED ORDER — ONDANSETRON HCL 4 MG/2ML IJ SOLN
INTRAMUSCULAR | Status: AC
  Filled 2013-08-24: qty 2

## 2013-08-24 MED ORDER — PROMETHAZINE HCL 25 MG/ML IJ SOLN: 6.2500 mg | INTRAMUSCULAR | Status: DC | PRN

## 2013-08-24 MED ORDER — SUFENTANIL CITRATE 50 MCG/ML IV SOLN
INTRAVENOUS | Status: DC | PRN
  Administered 2013-08-24 (×2): 10 ug via INTRAVENOUS

## 2013-08-24 MED ORDER — GLYCOPYRROLATE 0.2 MG/ML IJ SOLN
INTRAMUSCULAR | Status: AC
  Filled 2013-08-24: qty 4

## 2013-08-24 MED ORDER — STERILE WATER FOR IRRIGATION IR SOLN
Status: DC | PRN
  Administered 2013-08-24: 3000 mL

## 2013-08-24 MED ORDER — HYDRALAZINE HCL 20 MG/ML IJ SOLN
INTRAMUSCULAR | Status: AC
Start: 2013-08-24 — End: 2013-08-24
  Filled 2013-08-24: qty 1

## 2013-08-24 MED ORDER — SODIUM CHLORIDE 0.9 % IV BOLUS (SEPSIS)
1000.0000 mL | Freq: Once | INTRAVENOUS | Status: AC
  Administered 2013-08-24: 1000 mL via INTRAVENOUS

## 2013-08-24 MED ORDER — LACTATED RINGERS IV SOLN
INTRAVENOUS | Status: DC
  Administered 2013-08-24: 1000 mL via INTRAVENOUS
  Administered 2013-08-24: 15:00:00 via INTRAVENOUS

## 2013-08-24 MED ORDER — HYDROCODONE-ACETAMINOPHEN 5-325 MG PO TABS: 1.0000 | ORAL_TABLET | Freq: Four times a day (QID) | ORAL | Status: DC | PRN

## 2013-08-24 MED ORDER — ASPIRIN EC 81 MG PO TBEC
81.0000 mg | DELAYED_RELEASE_TABLET | Freq: Every day | ORAL | Status: DC
  Administered 2013-08-24 – 2013-08-25 (×2): 81 mg via ORAL
  Filled 2013-08-24 (×2): qty 1

## 2013-08-24 MED ORDER — LOSARTAN POTASSIUM 50 MG PO TABS
100.0000 mg | ORAL_TABLET | Freq: Every morning | ORAL | Status: DC
  Administered 2013-08-25: 100 mg via ORAL
  Filled 2013-08-24: qty 2

## 2013-08-24 MED ORDER — LIDOCAINE HCL (CARDIAC) 20 MG/ML IV SOLN
INTRAVENOUS | Status: AC
  Filled 2013-08-24: qty 5

## 2013-08-24 MED ORDER — HYDROMORPHONE HCL PF 1 MG/ML IJ SOLN
0.2500 mg | INTRAMUSCULAR | Status: DC | PRN
  Administered 2013-08-24: 0.25 mg via INTRAVENOUS

## 2013-08-24 MED ORDER — METOCLOPRAMIDE HCL 5 MG/ML IJ SOLN
INTRAMUSCULAR | Status: DC | PRN
  Administered 2013-08-24: 10 mg via INTRAVENOUS

## 2013-08-24 MED ORDER — CIPROFLOXACIN HCL 500 MG PO TABS: 500.0000 mg | ORAL_TABLET | Freq: Two times a day (BID) | ORAL | Status: DC

## 2013-08-24 MED ORDER — SUFENTANIL CITRATE 50 MCG/ML IV SOLN
INTRAVENOUS | Status: AC
  Filled 2013-08-24: qty 1

## 2013-08-24 MED ORDER — KCL IN DEXTROSE-NACL 20-5-0.45 MEQ/L-%-% IV SOLN
INTRAVENOUS | Status: DC
  Administered 2013-08-24 – 2013-08-25 (×2): via INTRAVENOUS
  Filled 2013-08-24 (×3): qty 1000

## 2013-08-24 SURGICAL SUPPLY — 51 items
ADH SKN CLS APL DERMABOND .7 (GAUZE/BANDAGES/DRESSINGS) ×2
CABLE HIGH FREQUENCY MONO STRZ (ELECTRODE) ×4 IMPLANT
CANISTER SUCTION 2500CC (MISCELLANEOUS) ×2 IMPLANT
CATH FOLEY 2WAY SLVR 18FR 30CC (CATHETERS) ×4 IMPLANT
CATH TIEMANN FOLEY 18FR 5CC (CATHETERS) ×4 IMPLANT
CHLORAPREP W/TINT 26ML (MISCELLANEOUS) ×4 IMPLANT
CLIP LIGATING HEM O LOK PURPLE (MISCELLANEOUS) ×12 IMPLANT
CLIP LIGATING HEMO LOK XL GOLD (MISCELLANEOUS) ×2 IMPLANT
CLOTH BEACON ORANGE TIMEOUT ST (SAFETY) ×4 IMPLANT
CONT SPECI 4OZ STER CLIK (MISCELLANEOUS) ×2 IMPLANT
COVER SURGICAL LIGHT HANDLE (MISCELLANEOUS) ×4 IMPLANT
COVER TIP SHEARS 8 DVNC (MISCELLANEOUS) ×2 IMPLANT
COVER TIP SHEARS 8MM DA VINCI (MISCELLANEOUS) ×2
CUTTER ECHEON FLEX ENDO 45 340 (ENDOMECHANICALS) ×4 IMPLANT
DECANTER SPIKE VIAL GLASS SM (MISCELLANEOUS) ×4 IMPLANT
DERMABOND ADVANCED (GAUZE/BANDAGES/DRESSINGS) ×2
DERMABOND ADVANCED .7 DNX12 (GAUZE/BANDAGES/DRESSINGS) ×2 IMPLANT
DRAPE SURG IRRIG POUCH 19X23 (DRAPES) ×4 IMPLANT
DRSG TEGADERM 4X4.75 (GAUZE/BANDAGES/DRESSINGS) ×8 IMPLANT
DRSG TEGADERM 6X8 (GAUZE/BANDAGES/DRESSINGS) ×8 IMPLANT
ELECT REM PT RETURN 9FT ADLT (ELECTROSURGICAL) ×4
ELECTRODE REM PT RTRN 9FT ADLT (ELECTROSURGICAL) ×2 IMPLANT
GAUZE SPONGE 2X2 8PLY STRL LF (GAUZE/BANDAGES/DRESSINGS) ×2 IMPLANT
GLOVE BIO SURGEON STRL SZ 6.5 (GLOVE) ×3 IMPLANT
GLOVE BIO SURGEONS STRL SZ 6.5 (GLOVE) ×1
GLOVE BIOGEL M STRL SZ7.5 (GLOVE) ×12 IMPLANT
GOWN STRL REUS W/TWL LRG LVL4 (GOWN DISPOSABLE) ×14 IMPLANT
HOLDER FOLEY CATH W/STRAP (MISCELLANEOUS) ×4 IMPLANT
IV LACTATED RINGERS 1000ML (IV SOLUTION) ×2 IMPLANT
KIT ACCESSORY DA VINCI DISP (KITS) ×2
KIT ACCESSORY DVNC DISP (KITS) ×2 IMPLANT
KIT PROCEDURE DA VINCI SI (MISCELLANEOUS) ×2
KIT PROCEDURE DVNC SI (MISCELLANEOUS) ×2 IMPLANT
NDL INSUFFLATION 14GA 120MM (NEEDLE) ×2 IMPLANT
NEEDLE INSUFFLATION 14GA 120MM (NEEDLE) ×4 IMPLANT
NEEDLE SPNL 22GX7 SPINOC (NEEDLE) ×4 IMPLANT
PACK ROBOT UROLOGY CUSTOM (CUSTOM PROCEDURE TRAY) ×4 IMPLANT
RELOAD GREEN ECHELON 45 (STAPLE) ×4 IMPLANT
SET TUBE IRRIG SUCTION NO TIP (IRRIGATION / IRRIGATOR) ×4 IMPLANT
SOLUTION ELECTROLUBE (MISCELLANEOUS) ×4 IMPLANT
SPONGE GAUZE 2X2 STER 10/PKG (GAUZE/BANDAGES/DRESSINGS)
SPONGE LAP 4X18 X RAY DECT (DISPOSABLE) ×4 IMPLANT
SUT ETHILON 3 0 PS 1 (SUTURE) ×4 IMPLANT
SUT MNCRL AB 4-0 PS2 18 (SUTURE) ×8 IMPLANT
SUT PDS AB 1 CT1 27 (SUTURE) ×8 IMPLANT
SUT VLOC BARB 180 ABS3/0GR12 (SUTURE) ×12
SUTURE VLOC BRB 180 ABS3/0GR12 (SUTURE) IMPLANT
SYR 27GX1/2 1ML LL SAFETY (SYRINGE) ×4 IMPLANT
TOWEL OR NON WOVEN STRL DISP B (DISPOSABLE) ×4 IMPLANT
TROCAR 12M 150ML BLUNT (TROCAR) ×4 IMPLANT
WATER STERILE IRR 1500ML POUR (IV SOLUTION) ×4 IMPLANT

## 2013-08-24 NOTE — Anesthesia Postprocedure Evaluation (Signed)
Anesthesia Post Note  Patient: Antonio Romero  Procedure(s) Performed: Procedure(s) (LRB): ROBOTIC ASSISTED LAPAROSCOPIC RADICAL PROSTATECTOMY WITH INDOCYANINE GREEN DYE (N/A) LYMPHADENECTOMY  (Bilateral)  Anesthesia type: General  Patient location: PACU  Post pain: Pain level controlled  Post assessment: Post-op Vital signs reviewed  Last Vitals:  Filed Vitals:   08/24/13 1015  BP: 166/79  Pulse: 60  Temp: 36.6 C  Resp: 18    Post vital signs: Reviewed  Level of consciousness: sedated  Complications: No apparent anesthesia complications

## 2013-08-24 NOTE — H&P (Signed)
Antonio Romero is an 68 y.o. male.    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 No FHX prostate Cancer. TRUS 47mL with moderate median lobe. CT and bone scan w/o distant or locally advanced disease.   [facility] nomogram predicts 10 year 99% CSS, but only 24% PFS with surgery alone. >20% chance of node-positive disease.  PMH sig for Aortic Valve Replacement (now off anticoagulants, follows [facility]), OSA. No DOE.    Today Wright is seen to proceed with robotic prostatectomy. He has cardiac clearance and remains on ASA 81.  Past Medical History  Diagnosis Date  . Coronary artery disease     a. Cath 2003 nl cors;  b. Cath 2011 nonobs dzs;  c.  10/2011 Cath: LAD mod prox dzs, mild LCX & RCA dzs.  . Bicuspid aortic valve     a. 09/2011 Echo: EF 45-50%, Bicusp AoV with mild-mod AI and Sev AS. Mod-sev dil LA, mild-mod MR;  b. 10/2011 Bioprosthetic AVR - Carpentier-Edwards #25;  c. 10/2011 post-op echo EF 52%, AoV prosthesis w/ 76mmHg peak and 67mmHg mean gradietn, Gr2 DD.  Marland Kitchen DVT (deep venous thrombosis)     a. following back surgery in past.  . Coumadin resistance     a. pt reports labile INR in past.  . PAF (paroxysmal atrial fibrillation)     a. s/p DCCV in 2011;  b. s/p Pulm Vein isolation (AtriCure) & LAA clipping (AtriClip) @ time of AVR 10/2011;  c. CHADS2=1  . HTN (hypertension)   . Complication of anesthesia     NAUSEA  . H/O aortic valve replacement 2013  . Dysrhythmia     HX A FIB - ABLATION 2013 - RESOLVED  . Prostate cancer   . History of kidney stones     Past Surgical History  Procedure Laterality Date  . Aortic valve replacement  2013  . Back surgery      Multiple procedures reported in the records    No family history on file. Social History:  reports that he has never smoked. He has  never used smokeless tobacco. He reports that he does not drink alcohol or use illicit drugs.  Allergies:  Allergies  Allergen Reactions  . Nitroglycerin Swelling    Of lips and tongue    No prescriptions prior to admission    No results found for this or any previous visit (from the past 48 hour(s)). No results found.  Review of Systems  Constitutional: Negative.  Negative for fever and chills.  HENT: Negative.   Eyes: Negative.   Respiratory: Negative.   Cardiovascular: Negative.   Gastrointestinal: Negative.   Genitourinary: Negative.   Musculoskeletal: Negative.   Skin: Negative.   Neurological: Negative.   Endo/Heme/Allergies: Negative.   Psychiatric/Behavioral: Negative.     There were no vitals taken for this visit. Physical Exam  Constitutional: He is oriented to person, place, and time. He appears well-developed and well-nourished.  HENT:  Head: Normocephalic and atraumatic.  Eyes: Pupils are equal, round, and reactive to light.  Neck: Normal range of motion. Neck supple.  Cardiovascular: Normal rate.   Remote sterntomy site c/d/i  Respiratory: Effort normal.  GI: Soft. Bowel sounds are normal.  Genitourinary: Penis normal.  Musculoskeletal: Normal range of motion.  Neurological: He is alert and oriented to person, place, and time.  Skin: Skin is warm and dry.  Psychiatric: He has a normal mood and affect. His behavior is normal. Judgment and thought content normal.     Assessment/Plan  1 - High Risk Prostate Cancer -   Fortunately staging studies negative for advanced disease. He wants surgery, he understands implications of high risk with estimated at least 50% chance of need for adjuvant therapy. He is on board with wide resection and understands sexual implications.   We rediscussed prostatectomy and specifically robotic prostatectomy with bilateral pelvic lymphadenectomy being the technique that I most commonly perform. I showed the patient on their  abdomen the approximately 6 small incision (trocar) sites as well as presumed extraction sites with robotic approach as well as possible open incision sites should open conversion be necessary. We rediscussed peri-operative risks including bleeding, infection, deep vein thrombosis, pulmonary embolism, compartment syndrome, nuropathy / neuropraxia, heart attack, stroke, death, as well as long-term risks such as non-cure / need for additional therapy. We specifically readdressed that the procedure would compromise urinary control leading to stress incontinence which typically resolves with time and pelvic rehabilitation (Kegel's, etc..), but can sometimes be permanent and require additional therapy including surgery. We also specifically readdressed sexual sequellae including significant erectile dysfunction which typically partially resolves with time but can also be permanent and require additional therapy including surgery.   We rediscussed the typical hospital course including usual 1-2 night hospitalization, discharge with foley catheter in place usually for 1-2 weeks before voiding trial as well as usually 2 week recovery until able to perform most non-strenuous activity and 6 weeks until able to return to most jobs and more strenuous activity such as exercise.   Pt voiced understanding and wants to proceed today as planned.  Alexis Frock 08/24/2013, 6:12 AM

## 2013-08-24 NOTE — Transfer of Care (Signed)
Immediate Anesthesia Transfer of Care Note  Patient: Antonio Romero  Procedure(s) Performed: Procedure(s): ROBOTIC ASSISTED LAPAROSCOPIC RADICAL PROSTATECTOMY WITH INDOCYANINE GREEN DYE (N/A) LYMPHADENECTOMY  (Bilateral)  Patient Location: PACU  Anesthesia Type:General  Level of Consciousness: awake and alert   Airway & Oxygen Therapy: Patient Spontanous Breathing and Patient connected to face mask oxygen  Post-op Assessment: Report given to PACU RN and Post -op Vital signs reviewed and stable  Post vital signs: Reviewed and stable  Complications: No apparent anesthesia complications

## 2013-08-24 NOTE — Anesthesia Preprocedure Evaluation (Signed)
Anesthesia Evaluation  Patient identified by MRN, date of birth, ID band Patient awake    Reviewed: Allergy & Precautions, H&P , NPO status , Patient's Chart, lab work & pertinent test results  Airway Mallampati: II TM Distance: >3 FB Neck ROM: Full    Dental  (+) Teeth Intact, Dental Advisory Given   Pulmonary neg pulmonary ROS,  breath sounds clear to auscultation  Pulmonary exam normal       Cardiovascular hypertension, Pt. on home beta blockers and Pt. on medications DVT negative cardio ROS  + dysrhythmias (S/P ablation for A fib) Atrial Fibrillation Rhythm:Regular Rate:Normal + Systolic murmurs a. 07/5007 Echo: EF 45-50%, Bicusp AoV with mild-mod AI and Sev AS. Mod-sev dil LA, mild-mod MR;    b. 10/2011 Bioprosthetic AVR - Carpentier-Edwards #25;    c. 10/2011 post-op echo EF 52%, AoV prosthesis w/ 89mmHg peak and 70mmHg mean gradietn, Gr2 DD.   Neuro/Psych negative neurological ROS  negative psych ROS   GI/Hepatic negative GI ROS, Neg liver ROS,   Endo/Other  negative endocrine ROS  Renal/GU negative Renal ROS  negative genitourinary   Musculoskeletal negative musculoskeletal ROS (+)   Abdominal   Peds  Hematology negative hematology ROS (+)   Anesthesia Other Findings   Reproductive/Obstetrics                           Anesthesia Physical Anesthesia Plan  ASA: III  Anesthesia Plan: General   Post-op Pain Management:    Induction: Intravenous  Airway Management Planned: Oral ETT  Additional Equipment:   Intra-op Plan:   Post-operative Plan: Extubation in OR  Informed Consent: I have reviewed the patients History and Physical, chart, labs and discussed the procedure including the risks, benefits and alternatives for the proposed anesthesia with the patient or authorized representative who has indicated his/her understanding and acceptance.   Dental advisory given  Plan  Discussed with: CRNA  Anesthesia Plan Comments:         Anesthesia Quick Evaluation

## 2013-08-24 NOTE — Brief Op Note (Signed)
08/24/2013  3:25 PM  PATIENT:  Antonio Romero  68 y.o. male  PRE-OPERATIVE DIAGNOSIS:  HIGH RISK PROSTATE CANCER  POST-OPERATIVE DIAGNOSIS:  HIGH RISK PROSTATE CANCER  PROCEDURE:  Procedure(s): ROBOTIC ASSISTED LAPAROSCOPIC RADICAL PROSTATECTOMY WITH INDOCYANINE GREEN DYE (N/A) LYMPHADENECTOMY  (Bilateral)  SURGEON:  Surgeon(s) and Role:    * Alexis Frock, MD - Primary  PHYSICIAN ASSISTANT:   ASSISTANTS: Felipa Furnace, PA   ANESTHESIA:   general  EBL:  Total I/O In: 1000 [I.V.:1000] Out: 50 [Blood:50]  BLOOD ADMINISTERED:none  DRAINS: 1 - foley, 2 - JP to straight drain   LOCAL MEDICATIONS USED:  MARCAINE     SPECIMEN:  Source of Specimen:  prostate, pelvic lymph nodes, periprostatic fat  DISPOSITION OF SPECIMEN:  PATHOLOGY  COUNTS:  YES  TOURNIQUET:  * No tourniquets in log *  DICTATION: .Other Dictation: Dictation Number [id number]  PLAN OF CARE: Admit to inpatient   PATIENT DISPOSITION:  PACU - hemodynamically stable.   Delay start of Pharmacological VTE agent (>24hrs) due to surgical blood loss or risk of bleeding: no

## 2013-08-24 NOTE — Discharge Instructions (Signed)
1. Activity:  You are encouraged to ambulate frequently (about every hour during waking hours) to help prevent blood clots from forming in your legs or lungs.  However, you should not engage in any heavy lifting (> 10-15 lbs), strenuous activity, or straining. 2. Diet: You should continue a clear liquid diet until passing gas from below.  Once this occurs, you may advance your diet to a soft diet that would be easy to digest (i.e soups, scrambled eggs, mashed potatoes, etc.) for 24 hours just as you would if getting over a bad stomach flu.  If tolerating this diet well for 24 hours, you may then begin eating regular food.  It will be normal to have some amount of bloating, nausea, and abdominal discomfort intermittently. 3. Prescriptions:  You will be provided a prescription for pain medication to take as needed.  If your pain is not severe enough to require the prescription pain medication, you may take extra strength Tylenol instead.  You should also take an over the counter stool softener (Colace 100 mg twice daily) to avoid straining with bowel movements as the pain medication may constipate you. Finally, you will also be provided a prescription for an antibiotic to begin the day prior to your return visit in the office for catheter removal. 4. Catheter care: You will be taught how to take care of the catheter by the nursing staff prior to discharge from the hospital.  You may use both a leg bag and the larger bedside bag but it is recommended to at least use the bigger bedside bag at nighttime as the leg bag is small and will fill up overnight and also does not drain as well when lying flat. You may periodically feel a strong urge to void with the catheter in place.  This is a bladder spasm and most often can occur when having a bowel movement or when you are moving around. It is typically self-limited and usually will stop after a few minutes.  You may use some Vaseline or Neosporin around the tip of the  catheter to reduce friction at the tip of the penis. 5. Incisions: You may remove your dressing bandages the 2nd day after surgery.  You most likely will have a few small staples in each of the incisions and once the bandages are removed, the incisions may stay open to air.  You may start showering (not soaking or bathing in water) 48 hours after surgery and the incisions simply need to be patted dry after the shower.  No additional care is needed. 6. What to call us about: You should call the office [phone]) if you develop fever > 101, persistent vomiting, or the catheter stops draining. Also, feel free to call with any other questions you may have and remember the handout that was provided to you as a reference preoperatively which answers many of the common questions that arise after surgery.  You may resume advil, aleve, vitamins, and supplements 7 days after surgery.

## 2013-08-25 LAB — HEMOGLOBIN AND HEMATOCRIT, BLOOD
HCT: 40.1 % (ref 39.0–52.0)
Hemoglobin: 13.5 g/dL (ref 13.0–17.0)

## 2013-08-25 LAB — CREATININE, FLUID (PLEURAL, PERITONEAL, JP DRAINAGE): Creat, Fluid: 1.1 mg/dL

## 2013-08-25 NOTE — Discharge Summary (Signed)
Physician Discharge Summary  Patient ID: Antonio Romero MRN: [id number] DOB/AGE: 1946-04-16 68 y.o.  Admit date: 08/24/2013 Discharge date: 08/25/2013  Admission Diagnoses: High Risk Prostate Cancer  Discharge Diagnoses:  High Risk Prostate Cancer Active Problems:   Prostate cancer   Discharged Condition: good  Hospital Course:   1 - High Risk Prostate Cancer - s/p robotic assisted laparoscopic radical prostatectomy with ICG lymphangiography and pelvic lymphadenectomy on 08/24/13, the day of admission, without acute complications. He was admitted to 4th floor Urology service post-op where he began his vigorous recovery. By 4/18, the day of discharge, he is ambulatory, pain controlled with PO meds, tolerating regular diet, JP drain removed as low output and Cr same as serum, and felt to be adequte for discharge. He was kept on aspirin peri-op as well as sub-Q heparin given his h/o bioprosthetic valve.   Consults: None  Significant Diagnostic Studies: labs: JP Cr <1.5. Pathology pending  Treatments: surgery: robotic assisted laparoscopic radical prostatectomy with ICG lymphangiography and pelvic lymphadenectomy on 08/24/13  Discharge Exam: Blood pressure 139/67, pulse 72, temperature 98.7 F (37.1 C), temperature source Oral, resp. rate 18, height 5\' 7"  (1.702 m), weight 99.973 kg (220 lb 6.4 oz), SpO2 97.00%. General appearance: alert, cooperative and appears stated age Head: Normocephalic, without obvious abnormality, atraumatic Throat: lips, mucosa, and tongue normal; teeth and gums normal Neck: supple, symmetrical, trachea midline Back: symmetric, no curvature. ROM normal. No CVA tenderness. Resp: no labored breathing Chest wall: no tenderness, old sternotomy scar well healed Cardio: Nl rate GI: soft, non-tender; bowel sounds normal; no masses,  no organomegaly Male genitalia: normal, foley c/d/i with light pink urine, no clots. Extremities: extremities normal, atraumatic, no  cyanosis or edema Pulses: 2+ and symmetric Skin: Skin color, texture, turgor normal. No rashes or lesions Lymph nodes: Cervical, supraclavicular, and axillary nodes normal. Neurologic: Grossly normal Incision/Wound: recent port sites and extraction sites c/d/i. JP removed and dry dressing applied, scant serosanguinous efflux in JP prior to removal.   Disposition:    Future Appointments Provider Department Dept Phone   10/22/2013 12:00 PM Minus Breeding, MD [facility] Office [phone]       Medication List         amLODipine 10 MG tablet  Commonly known as:  NORVASC  Take 1 tablet by mouth every morning.     aspirin EC 81 MG tablet  Take 81 mg by mouth daily.     ciprofloxacin 500 MG tablet  Commonly known as:  CIPRO  Take 1 tablet (500 mg total) by mouth 2 (two) times daily. Start day prior to office visit for foley removal     furosemide 40 MG tablet  Commonly known as:  LASIX  Take 1 tablet by mouth every morning.     HYDROcodone-acetaminophen 5-325 MG per tablet  Commonly known as:  NORCO  Take 1-2 tablets by mouth every 6 (six) hours as needed.     lansoprazole 30 MG capsule  Commonly known as:  PREVACID  Take 30 mg by mouth daily.     losartan 100 MG tablet  Commonly known as:  COZAAR  Take 1 tablet by mouth every morning.     metoprolol succinate 100 MG 24 hr tablet  Commonly known as:  TOPROL-XL  Take 1 tablet by mouth every morning.     potassium chloride SA 20 MEQ tablet  Commonly known as:  K-DUR,KLOR-CON  Take 1 tablet by mouth Daily.  Follow-up Information   Follow up with Alexis Frock, MD On 09/03/2013. (at 11:15)    Specialty:  Urology   Contact information:   [facility]  [place] [address] [phone]       Signed: Alexis Frock 08/25/2013, 11:50 AM

## 2013-08-25 NOTE — Op Note (Signed)
[name], [name]               ACCOUNT NO.:  [id number]  MEDICAL RECORD NO.:  [id number]  LOCATION:  8099                         FACILITY:  [facility]  PHYSICIAN:  Alexis Frock, MD     DATE OF BIRTH:  [birth year]  DATE OF PROCEDURE: 08/24/2013 DATE OF DISCHARGE:                              OPERATIVE REPORT   DIAGNOSIS:  High risk prostate cancer.  PROCEDURES: 1. Robotic-assisted laparoscopic radical prostatectomy. 2. Bilateral pelvic lymphangiectomy. 3. ICG lymphangiography with interpretation.  ESTIMATED BLOOD LOSS:  100 mL.  COMPLICATIONS:  None.  SPECIMENS: 1. Radical prostatectomy. 2. Right external iliac lymph nodes. 3. Right obturator lymph node, sentinel. 4. Periprostatic fat. 5. Left external iliac lymph nodes. 6. Left obturator lymph node, sentinel.  FINDINGS: 1. Hyperfluorescent obturator lymph nodes, bilaterally. 2. Small median lobe. 3. Dense desmoplastic reaction around the prostatic pedicles and right     posterior planes worrisome for possible extracapsular extension and     necessitating wide resection nerve sparing now performed.  INDICATION:  Antonio Romero is a pleasant 68 year old gentleman with history of significantly elevated PSA.  He was found on workup of this prostate biopsy to have high risk prostate cancer.  He underwent staging with CT and bone scan, was found to have no evidence of distant disease. Options were discussed for local therapy including radiotherapy versus surgery with and without minimally invasive assistance, he wished to proceed with the latter.  Given his high risk status, extensive lymphangiectomy was performed as well.  Informed consent was obtained and placed in the medical record.  PROCEDURE IN DETAIL:  The patient being Antonio Romero, was verified. Procedure being robotic prostatectomy with bilateral pelvic lymphadenectomy and ICG lymphangiography was confirmed.  Procedure was carried out.  Time-out was performed.   Intravenous antibiotics were administered.  General endotracheal anesthesia was introduced.  The patient was placed into a low lithotomy position.  Sterile field was created by prepping and draping the patient's infraxiphoid abdomen and chlorhexidine gluconate and his penis, perineum and proximal thighs using iodine x3.  He was further fashioned on the upper table with tucking his arms, placing sequential compression devices, and fastening his chest using 3-inch tape over foam padding.  A test of steep Trendelenburg position was performed, and he was found to be suitably positioned.  Next, a high-flow, low-pressure pneumoperitoneum was obtained using Veress technique in the infraumbilical midline having passed the aspiration and drop test.  A 12-mm robotic camera port was placed into this location and laparoscopic examination of the peritoneal cavity revealed no significant adhesions and no visceral injury. Additional ports were then placed as follows; right paramedian 8-mm robotic port, right far lateral 12-mm assistant port, right paramedian 5- mm suction port, left 8-mm paramedian robotic port, left far lateral 8- mm robotic port.  Robot was docked and passed through electronic checks. The left medial umbilical ligament was grasped and placed on gentle traction.  Incision was made lateral to this from the midline towards the area of the internal ring and coursing along the iliac vessels just lateral of the ureter.  The vas deferens was encountered and doubly ligated and placed in the medial traction using the bucket-handle.  The  left lateral bladder was then separated from the pelvic sidewall towards the area of the endopelvic fascia.  A mirror image dissection was performed on the right side incising the right peritoneum, sweeping the right bladder away from the pelvic sidewall towards the area of the endopelvic fascia.  Anterior attachments were then taken down using cautery  scissors, which exposed the anterior base of the prostate. This was defatted with this tissue set aside and labeled periprostatic fat.  Next, 0.2 mL of indocyanine green dye was injected via percutaneously placed robotic-guided needle into the left lobe of the prostate.  This was withdrawn with coagulation current being placed to the site to avoid spillage, which was not occurred.  Similarly, a 0.2 mL of ICG was instilled into the right lobe of the prostate in the similar fashion.  Next, the endopelvic fascia was carefully swept away from the lateral aspects of the prostate in a base-to-apex orientation and the dorsal venous complex was controlled using vascular load stapler and taking great care to avoid injury to the membranous urethra and this did not occur.  It had been approximately 15 minutes post dye injection. Attention was then directed at lymphangiography.  Inspection of the pelvis under near-infrared fluorescence revealed subtle hyperfluorescence of the obturator lymph node group, a single dominant noted in each one of these bilaterally.  No evidence of additional hyperfluorescent nodes were seen.  First on the left side, standard template lymphangiectomy was performed of the external iliac group confines being the external iliac artery, vein, pelvic side wall, and ureter medially.  Lymphostasis was achieved with cold clips.  This was set aside, labeled left external iliac lymph nodes.  Similarly, left obturator group was obtained.  Lymphostasis was achieved with cold clips, confines being the external iliac vein, pelvic side wall, obturator nerve.  Obturator nerve was inspected following these maneuvers and found to be uninjured.  Again, there was a single dominant hyperfluorescent node within this packet.  This was set aside and labelled left obturator lymph node as sentinel.  A mirror image lymphangiography was performed on the right side, setting aside again the right  external iliac lymph nodes and the right obturator lymph nodes.  The obturator group being sentinel.  The right obturator nerve inspected following these maneuvers and found to be uninjured and lymphostasis was achieved with cold clips.  Next, the area of the bladder neck was identified after moving the catheter back and forth. Incision was made in anterior-posterior direction in this location, separating the bladder neck from the base of the prostate keeping what appeared to be a rim of circular bladder neck fibers, but both the prostate and the bladder neck thus performing bladder neck preservation. Foley catheter balloon was taken down was carefully removed and a small median lobe was encountered and this had been expected where careful posterior dissection was performed keeping the median lobe with the prostatectomy specimens.  The plane of Denonvilliers was entered.  The bilateral vas deferens were dissected for distance approximately 2 cm ligated and placed on gentle superior traction.  The seminal vesicles were also encountered and dissected towards their tips, placed on gentle superior traction and this plane was further mobilized in a base-to-apex orientation.  Especially on the right side, the plane was very densely adherent, making very thick prostatic pedicle on the right side. Attention was then directed to control of the prostatic pedicles.  First on the left side, sequential clipping technique was used.  The prostatic pedicle progressed towards  the mid and apical portions of the prostate and  pedicle was incredibly adherent, worrisome for possible advanced disease, as such very wide resection was purposely performed in this area, thus not performing nerve sparing on the left side.  Similarly on the right side, the pedicle was controlled using sequential clipping technique.  Even more so, there was very dense desmoplastic reaction worrisome for possible locally-advanced  disease in this location and very wide dissection was performed again not performing nerve sparing on the right side.  Next, an apical dissection was performed by placing the prostate in gentle superior traction, identifying the area of the membranous urethra, which was transected coldly as of the posterior urethral plate.  This completely freed up the prostatectomy specimen, which was placed into an EndoCatch bag for later retrieval.  Inspection of the prostatectomy bed revealed excellent hemostasis, no evidence of visceral injury.  Rectal exam was performed with indicator glove using laparoscopic guidance and no evidence of rectal injury was noted.  Next, posterior reconstruction was performed using a single V-Loc suture, 3-0 in type, bringing the posterior urethral plate and the posterior bladder neck into apposition into tension-free fashion.  Next, bladder to urethral mucosa anastomosis was performed using double-armed V-Loc suture being at 6 o'clock traversing towards the 12 o'clock position. The bladder neck did not require tailoring, this caliber was only approximately 22-French diameter.  New Foley catheter was then placed and irrigated quantitatively.  No evidence of leak was seen.  Close suction drain was brought through the previous left lateral robotic port site the area of the peritoneal cavity.  The right far lateral 12-mm assistant port was closed at the level of the fascia using Rowan suture passer and 0 Vicryl.  Robot was then undocked.  Specimen was retrieved by extending the previous camera port site for total distance approximately 4 cm, removing the prostatectomy specimen and setting aside for permanent pathology.  This site was closed at the level of the fascia using figure-of-eight PDS x3 followed by Scarpa's reapproximation with running Vicryl.  All incisions were then infiltrated with dilute lipolyzed Marcaine and closed the level of skin using  subcuticular Monocryl followed by Dermabond.  Procedure was then terminated.  The patient tolerated the procedure well.  There were no immediate periprocedural complications, and the patient was taken to the postanesthesia care unit in stable condition.          ______________________________ Alexis Frock, MD     TM/MEDQ  D:  08/24/2013  T:  08/25/2013  Job:  [id number]

## 2013-08-27 ENCOUNTER — Encounter (HOSPITAL_COMMUNITY): Payer: Self-pay | Admitting: Urology

## 2013-10-05 ENCOUNTER — Encounter: Payer: Self-pay | Admitting: Cardiology

## 2013-10-22 ENCOUNTER — Ambulatory Visit: Payer: Medicare Other | Admitting: Cardiology

## 2013-11-30 NOTE — Progress Notes (Signed)
GU Location of Tumor / Histology: prostate cancer  If Prostate Cancer, Gleason Score is (4+4 = 8 in RMB, 4+3=7 RLA, RLM and 3+4=7 in RMA) and PSA is (0.29) and prostate volume was 58 ml.  Antonio Romero presented with a high PSA in January 2015.  Biopsies revealed:   08/24/13 Diagnosis 1. Fatty tissue, peri-prostatic - MATURE ADIPOSE TISSUE, NO LYMPHOID TISSUE IDENTIFIED. - NO EVIDENCE OF MALIGNANCY. 2. Lymph nodes, regional resection, left external iliac - TWO LYMPH NODES, NEGATIVE FOR METASTATIC CARCINOMA (0/2). 3. Lymph nodes, regional resection, left obturator - THREE LYMPH NODES, NEGATIVE FOR METASTATIC CARCINOMA (0/3). 4. Lymph nodes, regional resection, right external iliac - FOUR LYMPH NODES, NEGATIVE FOR METASTATIC CARCINOMA (0/4). 5. Lymph nodes, regional resection, right obturator sentinel - THREE LYMPH NODES, NEGATIVE FOR METASTATIC CARCINOMA (0/3). 6. Prostate, radical resection - PROSTATIC ADENOCARCINOMA, GLEASON SCORE 4 + 3 = 7 WITH TERTIARY PATTERN 5, INVOLVING THE RIGHT APICAL AND POSTERIOR MARGIN. - EXTRAPROSTATIC EXTENSION AND SEMINAL VESICLE INVOLVEMENT PRESENT. - PLEASE SEE ONCOLOGY TEMPLATE FOR DETAILS.  Past/Anticipated interventions by urology, if any: 08/24/13 Procedure: ROBOTIC ASSISTED LAPAROSCOPIC RADICAL PROSTATECTOMY WITH INDOCYANINE GREEN DYE;  Surgeon: Alexis Frock, MD;  Location: [facility];  Service: Urology;  Laterality: N/A and Procedure: LYMPHADENECTOMY ;  Surgeon: Alexis Frock, MD;  Location: [facility];  Service: Urology;  Laterality: Bilateral;  Past/Anticipated interventions by medical oncology, if any: none  Weight changes, if any: no  Bowel/Bladder complaints, if any: no bowel issues.  IPSS score of 6.  Patient is taking a diuretic so urniated frequently.   Nausea/Vomiting, if any: no  Pain issues, if any:  no  SAFETY ISSUES:  Prior radiation? no  Pacemaker/ICD? no  Possible current pregnancy? no  Is the patient on methotrexate?  no  Current Complaints / other details:  Patient is here with his wife.  He has 2 children.  Had an aortic valve replacement in 2013.

## 2013-12-03 ENCOUNTER — Ambulatory Visit (INDEPENDENT_AMBULATORY_CARE_PROVIDER_SITE_OTHER): Payer: Medicare Other | Admitting: Cardiology

## 2013-12-03 ENCOUNTER — Encounter: Payer: Self-pay | Admitting: Cardiology

## 2013-12-03 VITALS — BP 169/9 | HR 67 | Ht 67.0 in | Wt 214.0 lb

## 2013-12-03 DIAGNOSIS — I350 Nonrheumatic aortic (valve) stenosis: Secondary | ICD-10-CM

## 2013-12-03 DIAGNOSIS — Q231 Congenital insufficiency of aortic valve: Secondary | ICD-10-CM

## 2013-12-03 DIAGNOSIS — I480 Paroxysmal atrial fibrillation: Secondary | ICD-10-CM

## 2013-12-03 DIAGNOSIS — I2510 Atherosclerotic heart disease of native coronary artery without angina pectoris: Secondary | ICD-10-CM

## 2013-12-03 DIAGNOSIS — I4891 Unspecified atrial fibrillation: Secondary | ICD-10-CM

## 2013-12-03 DIAGNOSIS — I359 Nonrheumatic aortic valve disorder, unspecified: Secondary | ICD-10-CM

## 2013-12-03 NOTE — Patient Instructions (Signed)
Your physician recommends that you schedule a follow-up appointment in: one year with Dr. Hochrein  

## 2013-12-03 NOTE — Progress Notes (Signed)
HPI The patient presents for evaluation of aortic valve replacement. He had bicuspid aortic valve and had this replaced in [place] last year. He's had a mildly reduced ejection fraction and minimal atherosclerotic coronary disease. He did have atrial fibrillation but he had pulmonary vein isolation, ligation of his atrial appendage then he has had no further dysrhythmias.  He has had a follow up echo with stable AVR.  Since I last saw him he has had prostatectomy for prostate CA and is going to have radiation.  He is still pedaling his bike. The patient denies any new symptoms such as chest discomfort, neck or arm discomfort. There has been no new shortness of breath, PND or orthopnea. There have been no reported palpitations, presyncope or syncope.  Allergies  Allergen Reactions  . Nitroglycerin Swelling    Of lips and tongue    Current Outpatient Prescriptions  Medication Sig Dispense Refill  . amLODipine (NORVASC) 10 MG tablet Take 1 tablet by mouth every morning.       Marland Kitchen aspirin EC 81 MG tablet Take 81 mg by mouth daily.      . furosemide (LASIX) 40 MG tablet Take 1 tablet by mouth every morning.       . lansoprazole (PREVACID) 30 MG capsule Take 30 mg by mouth daily.      Marland Kitchen losartan (COZAAR) 100 MG tablet Take 1 tablet by mouth every morning.       . metoprolol succinate (TOPROL-XL) 100 MG 24 hr tablet Take 1 tablet by mouth every morning.       . potassium chloride SA (K-DUR,KLOR-CON) 20 MEQ tablet Take 1 tablet by mouth Daily.       No current facility-administered medications for this visit.    Past Medical History  Diagnosis Date  . Coronary artery disease     a. Cath 2003 nl cors;  b. Cath 2011 nonobs dzs;  c.  10/2011 Cath: LAD mod prox dzs, mild LCX & RCA dzs.  . Bicuspid aortic valve     a. 09/2011 Echo: EF 45-50%, Bicusp AoV with mild-mod AI and Sev AS. Mod-sev dil LA, mild-mod MR;  b. 10/2011 Bioprosthetic AVR - Carpentier-Edwards #25;  c. 10/2011 post-op  echo EF 52%, AoV prosthesis w/ 64mmHg peak and 11mmHg mean gradietn, Gr2 DD.  Marland Kitchen DVT (deep venous thrombosis)     a. following back surgery in past.  . Coumadin resistance     a. pt reports labile INR in past.  . PAF (paroxysmal atrial fibrillation)     a. s/p DCCV in 2011;  b. s/p Pulm Vein isolation (AtriCure) & LAA clipping (AtriClip) @ time of AVR 10/2011;  c. CHADS2=1  . HTN (hypertension)   . Complication of anesthesia     NAUSEA  . H/O aortic valve replacement 2013  . Dysrhythmia     HX A FIB - ABLATION 2013 - RESOLVED  . Prostate cancer   . History of kidney stones     Past Surgical History  Procedure Laterality Date  . Aortic valve replacement  2013  . Back surgery      Multiple procedures reported in the records  . Robot assisted laparoscopic radical prostatectomy N/A 08/24/2013    Procedure: ROBOTIC ASSISTED LAPAROSCOPIC RADICAL PROSTATECTOMY WITH INDOCYANINE GREEN DYE;  Surgeon: Alexis Frock, MD;  Location: [facility];  Service: Urology;  Laterality: N/A;  . Lymphadenectomy Bilateral 08/24/2013    Procedure: LYMPHADENECTOMY ;  Surgeon: Alexis Frock, MD;  Location: [facility];  Service: Urology;  Laterality: Bilateral;   ROS:  Positive for ED.  Otherwise as stated in the HPI and negative for all other systems.  PHYSICAL EXAM BP 169/9  Pulse 67  Ht 5\' 7"  (1.702 m)  Wt 214 lb (97.07 kg)  BMI 33.51 kg/m2 GENERAL:  Well appearing HEENT:  Pupils equal round and reactive, fundi not visualized, oral mucosa unremarkable NECK:  No jugular venous distention, waveform within normal limits, carotid upstroke brisk and symmetric, no bruits, no thyromegaly LYMPHATICS:  No cervical, inguinal adenopathy LUNGS:  Clear to auscultation bilaterally BACK:  No CVA tenderness CHEST:  Well healed sternotomy scar. HEART:  PMI not displaced or sustained,S1 and S2 within normal limits, no S3, no S4, no clicks, no rubs,  soft apical early peaking systolic murmur, no diastolic murmurs ABD:  Flat,  positive bowel sounds normal in frequency in pitch, no bruits, no rebound, no guarding, no midline pulsatile mass, no hepatomegaly, no splenomegaly EXT:  2 plus pulses throughout,  mild bilateral lower extremity edema, no cyanosis no clubbing   EKG:  Sinus rhythm, rate 62, axis within normal limits, intervals within normal limits, no acute ST-T wave changes.  12/03/2013   ASSESSMENT AND PLAN  Bicuspid AoV/Severe Ao Stenosis:  He is doing well symptomatically with this.  No further imaging is indicated.  No change in therapy.     HTN: BP elevated today.   However, this has not been elevated at home and he has kept a watch on this. I will leave him on the meds as listed. We discussed losing weight to further manage his blood pressure.

## 2013-12-05 ENCOUNTER — Ambulatory Visit
Admission: RE | Admit: 2013-12-05 | Discharge: 2013-12-05 | Disposition: A | Discharge status: Home / Self Care | Payer: Medicare Other | Source: Ambulatory Visit | Attending: Radiation Oncology | Admitting: Radiation Oncology

## 2013-12-05 ENCOUNTER — Encounter: Payer: Self-pay | Admitting: Radiation Oncology

## 2013-12-05 VITALS — BP 157/86 | HR 65 | Temp 97.9°F | Ht 67.0 in | Wt 215.9 lb

## 2013-12-05 DIAGNOSIS — Z7982 Long term (current) use of aspirin: Secondary | ICD-10-CM | POA: Diagnosis not present

## 2013-12-05 DIAGNOSIS — Z9079 Acquired absence of other genital organ(s): Secondary | ICD-10-CM | POA: Insufficient documentation

## 2013-12-05 DIAGNOSIS — Z510 Encounter for antineoplastic radiation therapy: Secondary | ICD-10-CM | POA: Insufficient documentation

## 2013-12-05 DIAGNOSIS — Z79899 Other long term (current) drug therapy: Secondary | ICD-10-CM | POA: Diagnosis not present

## 2013-12-05 DIAGNOSIS — I10 Essential (primary) hypertension: Secondary | ICD-10-CM | POA: Insufficient documentation

## 2013-12-05 DIAGNOSIS — Z954 Presence of other heart-valve replacement: Secondary | ICD-10-CM | POA: Diagnosis not present

## 2013-12-05 DIAGNOSIS — C61 Malignant neoplasm of prostate: Secondary | ICD-10-CM | POA: Diagnosis not present

## 2013-12-05 NOTE — Progress Notes (Signed)
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   HISTORY OF PRESENT ILLNESS::Antonio Romero is a 68 y.o. male who is seen out of the courtesy of Dr. Tresa Moore for an opinion concerning radiation therapy as part of management of patient's high risk adenocarcinoma of the prostate. Earlier this year on routine screening  the patient was found to have elevated PSA of 9.6. The patient was referred to Dr. Tresa Moore for further evaluation.  The patient proceeded to undergo ultrasound-guided needle biopsy of the prostate. On ultrasound the prostate gland was 57.99 cc. From the right base the patient was found to have a Gleason's 8 (4+4) adenocarcinoma which involved 50% of one core. In addition 3 other areas within the prostate gland showed Gleason's 7 prostate cancer. Perineural invasion was noted in the right lateral base. The patient proceeded to undergo staging workup with bone scan scan and  abdomen and pelvis CT showing no evidence of metastasis. The patient after careful consideration wish to proceed with prostatectomy. On April 17 the patient underwent robotic-assisted laparoscopic radical prostatectomy with ICG senital +template pelvic lymphadenectomy. The patient was found to have prostatic adenocarcinoma, Gleason score 7 ( 4+3) with a tertiary pattern are 5 involving the right apical and posterior margin. In addition there was extraprostatic extension and seminal vesicle involvement. Patient had 12 benign lymph nodes removed from the pelvis region. Tumor did involve both seminal vesicles. Postoperatively the patient is doing quite well with rapid improvement in  his urinary incontinence. The patient's first PSA after his surgery was in June with a PSA of 0.22 a repeat measurement in July showed a elevation of the PSA to 0.29. In light of the high risk nature of the patient's malignancy, detectable and rising PSA he is now referred to radiation oncology for consideration for treatment.   PREVIOUS RADIATION THERAPY: No  PAST MEDICAL HISTORY:  has a past medical history of Coronary artery disease; Bicuspid aortic valve; DVT (deep venous thrombosis); Coumadin resistance; PAF (paroxysmal atrial fibrillation); HTN (hypertension); Complication of anesthesia; H/O aortic valve replacement (2013); Dysrhythmia; Prostate cancer; and History of kidney stones.    PAST SURGICAL HISTORY: Past Surgical History  Procedure Laterality Date  . Aortic valve replacement  2013  . Back surgery      Multiple procedures reported in the records  . Robot assisted laparoscopic radical prostatectomy N/A 08/24/2013    Procedure: ROBOTIC ASSISTED LAPAROSCOPIC RADICAL PROSTATECTOMY WITH INDOCYANINE GREEN DYE;  Surgeon: Alexis Frock, MD;  Location: [facility];  Service: Urology;  Laterality: N/A;  . Lymphadenectomy Bilateral 08/24/2013    Procedure: LYMPHADENECTOMY ;  Surgeon: Alexis Frock, MD;  Location: [facility];  Service: Urology;  Laterality: Bilateral;    FAMILY HISTORY: family history includes Colon cancer in his brother.  SOCIAL HISTORY:  reports that he has never smoked. He has never used smokeless tobacco. He reports that he does not drink alcohol or use illicit drugs.  ALLERGIES: Nitroglycerin  MEDICATIONS:  Current Outpatient Prescriptions  Medication Sig Dispense Refill  . amLODipine (NORVASC) 10 MG tablet Take 1 tablet by mouth every morning.       Marland Kitchen aspirin EC 81 MG tablet Take  81 mg by mouth daily.      . furosemide (LASIX) 40 MG tablet Take 1 tablet by mouth every morning.       . lansoprazole (PREVACID) 30 MG capsule Take 30 mg by mouth daily.      Marland Kitchen losartan  (COZAAR) 100 MG tablet Take 1 tablet by mouth every morning.       . metoprolol succinate (TOPROL-XL) 100 MG 24 hr tablet Take 1 tablet by mouth every morning.       . potassium chloride SA (K-DUR,KLOR-CON) 20 MEQ tablet Take 1 tablet by mouth Daily.       No current facility-administered medications for this encounter.    REVIEW OF SYSTEMS:  A 15 point review of systems is documented in the electronic medical record. This was obtained by the nursing staff. However, I reviewed this with the patient to discuss relevant findings and make the patient has some stress urinary incontinence. He is now managing with one pad daily.  the patient completed the international prostate symptom score with total score of 5 representing minimal symptomatology. Most significant value was in nocturia.  Erectile dysfunction has not been addressed since his surgery but expected to be present given his radical non-nerve sparing surgery.  The patient has chronic pain in his lower back from prior disc problems and subsequent spinal fusion   PHYSICAL EXAM:  height is 5\' 7"  (1.702 m) and weight is 215 lb 14.4 oz (97.932 kg). His oral temperature is 97.9 F (36.6 C). His blood pressure is 157/86 and his pulse is 65.   BP 157/86  Pulse 65  Temp(Src) 97.9 F (36.6 C) (Oral)  Ht 5\' 7"  (1.702 m)  Wt 215 lb 14.4 oz (97.932 kg)  BMI 33.81 kg/m2  General Appearance:    Alert, cooperative, no distress, appears stated age, accompanied by his wife on evaluation today   Head:    Normocephalic, without obvious abnormality, atraumatic  Eyes:    PERRL, conjunctiva/corneas clear, EOM's intact,       Ears:    Normal TM's and external ear canals, both ears  Nose:   Nares normal, septum midline, mucosa normal, no drainage    or sinus tenderness  Throat:   Lips, mucosa, and tongue normal;  gums normal  Neck:   Supple, symmetrical, trachea midline, no adenopathy;       thyroid:  No enlargement/tenderness/nodules; no carotid   bruit or  JVD  Back:     Symmetric, no curvature, ROM normal, no CVA tenderness  Lungs:     Clear to auscultation bilaterally, respirations unlabored  Chest wall:    No tenderness or deformity  Heart:    Regular rate and rhythm, S1 and S2 normal, no murmur, rub   or gallop  Abdomen:     Soft, non-tender, bowel sounds active all four quadrants,    no masses, no organomegaly  Genitalia:    Normal male without lesion, discharge or tenderness, uncircumcised, no testicular masses   Rectal:    Deferred in light of surgery  Extremities:   Extremities normal, atraumatic, no cyanosis or edema  Pulses:   2+ and symmetric all extremities  Skin:   Skin color, texture, turgor normal, no rashes or lesions  Lymph nodes:   Cervical, supraclavicular, and axillary nodes normal  Neurologic:    Normal strength, sensation and reflexes      throughout     ECOG = 0  0 - Asymptomatic (Fully active, able to carry on  all predisease activities without restriction)  LABORATORY DATA:  Lab Results  Component Value Date   WBC 6.0 08/16/2013   HGB 13.5 08/25/2013   HCT 40.1 08/25/2013   MCV 89.1 08/16/2013   PLT 146* 08/16/2013   NEUTROABS 4.1 09/27/2008   Lab Results  Component Value Date   NA 139 08/24/2013   K 4.1 08/24/2013   CL 101 08/24/2013   CO2 26 08/24/2013   GLUCOSE 182* 08/24/2013   CREATININE 1.11 08/24/2013   CALCIUM 9.2 08/24/2013      RADIOGRAPHY: Bone scan and abdominal/pelvic CT scan showing no evidence of metastatic disease - preop    IMPRESSION:  Stage pT3b, N0, 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  Patient is at high risk for recurrence and would likely benefit from radiation therapy directed at the lower pelvis region. The patient does have a detectable PSA postop in this appears to be rising suggestive of residual disease.  I discussed the treatment course side effects and potential toxicities of radiation therapy in this situation with the  patient and his wife. He appears to understand and wishes to proceed with planned course of treatment.   PLAN: Adjuvant radiation therapy. I will discuss with Dr. Tresa Moore when it would be appropriate to proceed with the patient's radiation therapy.  I anticipate a dose of approximately 68.4 Gy  I spent 60 minutes minutes face to face with the patient and more than 50% of that time was spent in counseling and/or coordination of care.   ------------------------------------------------  Blair Promise, PhD, MD

## 2013-12-05 NOTE — Progress Notes (Signed)
Please see the Nurse Progress Note in the MD Initial Consult Encounter for this patient. 

## 2013-12-19 ENCOUNTER — Telehealth: Payer: Self-pay | Admitting: Oncology

## 2013-12-19 NOTE — Telephone Encounter (Signed)
Yarnell called and said he was supposed to call this week if he had not heard from Dr. Sondra Come regarding the mold for his hip.  Advised him that I would contact Dr. Sondra Come and call him back later today or tomorrow morning.

## 2013-12-26 ENCOUNTER — Telehealth: Payer: Self-pay | Admitting: *Deleted

## 2013-12-26 NOTE — Telephone Encounter (Signed)
Spoke with patient to let him know Dr. Sondra Come has patient chart on his desk to consult with Dr. Tresa Moore regarding future Simulation.  Antonio Romero acknowledged and said that was fine.

## 2014-01-16 ENCOUNTER — Ambulatory Visit
Admission: RE | Admit: 2014-01-16 | Discharge: 2014-01-16 | Disposition: A | Discharge status: Home / Self Care | Payer: Medicare Other | Source: Ambulatory Visit | Attending: Radiation Oncology | Admitting: Radiation Oncology

## 2014-01-16 DIAGNOSIS — C61 Malignant neoplasm of prostate: Secondary | ICD-10-CM

## 2014-01-16 DIAGNOSIS — Z510 Encounter for antineoplastic radiation therapy: Secondary | ICD-10-CM | POA: Diagnosis not present

## 2014-01-18 NOTE — Progress Notes (Signed)
  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   NARRATIVE:  The patient was brought to the [facility].  Identity was confirmed.  All relevant records and images related to the planned course of therapy were reviewed.  The patient freely provided informed written consent to proceed with treatment after reviewing the details related to the planned course of therapy. The consent form was witnessed and verified by the simulation staff.  Then, the patient was set-up in a stable reproducible  supine position for radiation therapy.  CT images were obtained.  Surface markings were placed.  The CT images were loaded into the planning software.  Then the target and avoidance structures were contoured.  Treatment planning then occurred.  The radiation prescription was entered and confirmed.  Then, I designed and supervised the construction of a total of 1 medically necessary complex treatment devices.  I have requested : Intensity Modulated Radiotherapy (IMRT) is medically necessary for this case for the following reason:  Rectal sparing..  I have ordered:dose calc.  PLAN:  The patient will receive 68.4 Gy in 38 fractions using helical intensity modulated radiation therapy.  ________________________________  -----------------------------------  Blair Promise, PhD, MD

## 2014-01-21 DIAGNOSIS — Z510 Encounter for antineoplastic radiation therapy: Secondary | ICD-10-CM | POA: Diagnosis not present

## 2014-01-24 DIAGNOSIS — Z510 Encounter for antineoplastic radiation therapy: Secondary | ICD-10-CM | POA: Diagnosis not present

## 2014-01-28 ENCOUNTER — Ambulatory Visit: Payer: Medicare Other | Admitting: Radiation Oncology

## 2014-01-29 ENCOUNTER — Ambulatory Visit: Payer: Medicare Other

## 2014-01-29 ENCOUNTER — Ambulatory Visit
Admission: RE | Admit: 2014-01-29 | Discharge: 2014-01-29 | Disposition: A | Discharge status: Home / Self Care | Payer: Medicare Other | Source: Ambulatory Visit | Attending: Radiation Oncology | Admitting: Radiation Oncology

## 2014-01-29 VITALS — BP 148/85 | HR 69 | Temp 98.6°F | Resp 16 | Ht 67.0 in | Wt 217.4 lb

## 2014-01-29 DIAGNOSIS — Z510 Encounter for antineoplastic radiation therapy: Secondary | ICD-10-CM | POA: Diagnosis not present

## 2014-01-29 DIAGNOSIS — C61 Malignant neoplasm of prostate: Secondary | ICD-10-CM

## 2014-01-29 NOTE — Progress Notes (Signed)
Antonio Romero has completed 1 fraction to his prostate.  He does not have nay complaints today.  He did have trouble today keeping his bladder full before treatment.  He is wondering when he will see a decline in his PSA after treatment.  He was given the Radiation Therapy and You book and discusses management of fatigue, diarrhea, skin irritation and bladder changes.  He was advised to call with any questions or concerns.

## 2014-01-29 NOTE — Progress Notes (Signed)
  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    Current Dose: 1.8 Gy     Planned Dose:  68.4 Gy  Narrative . . . . . . . . The patient presents for routine under treatment assessment.                                   The patient is without complaint.                                 Set-up films were reviewed.                                 The chart was checked. Physical Findings. . .  height is 5\' 7"  (1.702 m) and weight is 217 lb 6.4 oz (98.612 kg). His oral temperature is 98.6 F (37 C). His blood pressure is 148/85 and his pulse is 69. His respiration is 16. . The lungs are clear. The heart has a regular rhythm and rate. The abdomen is soft and nontender with normal bowel sounds. Impression . . . . . . . The patient is tolerating radiation. Plan . . . . . . . . . . . . Continue treatment as planned.  ________________________________   Blair Promise, PhD, MD

## 2014-01-30 ENCOUNTER — Ambulatory Visit
Admission: RE | Admit: 2014-01-30 | Discharge: 2014-01-30 | Disposition: A | Discharge status: Home / Self Care | Payer: Medicare Other | Source: Ambulatory Visit | Attending: Radiation Oncology | Admitting: Radiation Oncology

## 2014-01-30 DIAGNOSIS — Z510 Encounter for antineoplastic radiation therapy: Secondary | ICD-10-CM | POA: Diagnosis not present

## 2014-01-31 ENCOUNTER — Ambulatory Visit
Admission: RE | Admit: 2014-01-31 | Discharge: 2014-01-31 | Disposition: A | Discharge status: Home / Self Care | Payer: Medicare Other | Source: Ambulatory Visit | Attending: Radiation Oncology | Admitting: Radiation Oncology

## 2014-01-31 DIAGNOSIS — Z510 Encounter for antineoplastic radiation therapy: Secondary | ICD-10-CM | POA: Diagnosis not present

## 2014-02-01 ENCOUNTER — Ambulatory Visit
Admission: RE | Admit: 2014-02-01 | Discharge: 2014-02-01 | Disposition: A | Discharge status: Home / Self Care | Payer: Medicare Other | Source: Ambulatory Visit | Attending: Radiation Oncology | Admitting: Radiation Oncology

## 2014-02-01 DIAGNOSIS — Z510 Encounter for antineoplastic radiation therapy: Secondary | ICD-10-CM | POA: Diagnosis not present

## 2014-02-04 ENCOUNTER — Ambulatory Visit
Admission: RE | Admit: 2014-02-04 | Discharge: 2014-02-04 | Disposition: A | Discharge status: Home / Self Care | Payer: Medicare Other | Source: Ambulatory Visit | Attending: Radiation Oncology | Admitting: Radiation Oncology

## 2014-02-04 DIAGNOSIS — Z510 Encounter for antineoplastic radiation therapy: Secondary | ICD-10-CM | POA: Diagnosis not present

## 2014-02-05 ENCOUNTER — Ambulatory Visit: Admission: RE | Admit: 2014-02-05 | Payer: Medicare Other | Source: Ambulatory Visit | Admitting: Radiation Oncology

## 2014-02-05 ENCOUNTER — Ambulatory Visit
Admission: RE | Admit: 2014-02-05 | Discharge: 2014-02-05 | Disposition: A | Discharge status: Home / Self Care | Payer: Medicare Other | Source: Ambulatory Visit | Attending: Radiation Oncology | Admitting: Radiation Oncology

## 2014-02-05 ENCOUNTER — Encounter: Payer: Self-pay | Admitting: Radiation Oncology

## 2014-02-05 VITALS — BP 153/81 | HR 63 | Temp 98.3°F | Resp 20 | Ht 67.0 in | Wt 216.4 lb

## 2014-02-05 DIAGNOSIS — Z510 Encounter for antineoplastic radiation therapy: Secondary | ICD-10-CM | POA: Diagnosis not present

## 2014-02-05 DIAGNOSIS — C61 Malignant neoplasm of prostate: Secondary | ICD-10-CM

## 2014-02-05 NOTE — Progress Notes (Addendum)
Antonio Romero has completed 6/38 fractions to his prostate.  He denies pain, hematuria, increase in urinary frequency, dysuria and diarrhea. He continues to have nocturia 4-5 times a night from lasix.  He also reports leg cramps in his thighs and calves that wake him up.  He is taking potassium and his doctor is aware.

## 2014-02-05 NOTE — Progress Notes (Signed)
Weekly Management Note Current Dose: 10.8  Gy  Projected Dose: 68.4 Gy   Narrative:  The patient presents for routine under treatment assessment.  CBCT/MVCT images/Port film x-rays were reviewed.  The chart was checked. Urinary symptoms stable. Some leg cramping (? From potassium). Switching PCP eating banaas as needed and taking potassium supplements.   Physical Findings: Weight: 216 lb 6.4 oz (98.158 kg). Alert. No distress.  Impression:  The patient is tolerating radiation.  Plan:  Continue treatment as planned. Discussed need to monitor potassium. Stressed need for follow up with PCP.

## 2014-02-06 ENCOUNTER — Ambulatory Visit
Admission: RE | Admit: 2014-02-06 | Discharge: 2014-02-06 | Disposition: A | Discharge status: Home / Self Care | Payer: Medicare Other | Source: Ambulatory Visit | Attending: Radiation Oncology | Admitting: Radiation Oncology

## 2014-02-06 DIAGNOSIS — Z510 Encounter for antineoplastic radiation therapy: Secondary | ICD-10-CM | POA: Diagnosis not present

## 2014-02-07 ENCOUNTER — Ambulatory Visit
Admission: RE | Admit: 2014-02-07 | Discharge: 2014-02-07 | Disposition: A | Discharge status: Home / Self Care | Payer: Medicare Other | Source: Ambulatory Visit | Attending: Radiation Oncology | Admitting: Radiation Oncology

## 2014-02-07 DIAGNOSIS — Z510 Encounter for antineoplastic radiation therapy: Secondary | ICD-10-CM | POA: Diagnosis present

## 2014-02-07 DIAGNOSIS — C61 Malignant neoplasm of prostate: Secondary | ICD-10-CM | POA: Diagnosis not present

## 2014-02-08 ENCOUNTER — Ambulatory Visit
Admission: RE | Admit: 2014-02-08 | Discharge: 2014-02-08 | Disposition: A | Discharge status: Home / Self Care | Payer: Medicare Other | Source: Ambulatory Visit | Attending: Radiation Oncology | Admitting: Radiation Oncology

## 2014-02-08 DIAGNOSIS — Z510 Encounter for antineoplastic radiation therapy: Secondary | ICD-10-CM | POA: Diagnosis not present

## 2014-02-11 ENCOUNTER — Ambulatory Visit
Admission: RE | Admit: 2014-02-11 | Discharge: 2014-02-11 | Disposition: A | Discharge status: Home / Self Care | Payer: Medicare Other | Source: Ambulatory Visit | Attending: Radiation Oncology | Admitting: Radiation Oncology

## 2014-02-11 DIAGNOSIS — Z510 Encounter for antineoplastic radiation therapy: Secondary | ICD-10-CM | POA: Diagnosis not present

## 2014-02-12 ENCOUNTER — Inpatient Hospital Stay
Admission: RE | Admit: 2014-02-12 | Discharge: 2014-02-12 | Disposition: A | Discharge status: Home / Self Care | Payer: Self-pay | Source: Ambulatory Visit | Attending: Radiation Oncology | Admitting: Radiation Oncology

## 2014-02-12 ENCOUNTER — Ambulatory Visit
Admission: RE | Admit: 2014-02-12 | Discharge: 2014-02-12 | Disposition: A | Discharge status: Home / Self Care | Payer: Medicare Other | Source: Ambulatory Visit | Attending: Radiation Oncology | Admitting: Radiation Oncology

## 2014-02-12 ENCOUNTER — Encounter: Payer: Self-pay | Admitting: Radiation Oncology

## 2014-02-12 VITALS — BP 140/82 | HR 63 | Temp 98.6°F | Resp 16 | Ht 67.0 in | Wt 216.8 lb

## 2014-02-12 DIAGNOSIS — C61 Malignant neoplasm of prostate: Secondary | ICD-10-CM

## 2014-02-12 DIAGNOSIS — Z510 Encounter for antineoplastic radiation therapy: Secondary | ICD-10-CM | POA: Diagnosis not present

## 2014-02-12 NOTE — Progress Notes (Signed)
Antonio Romero has compelted 11 fractions to his prostate.  He denies pain.  He continues to get up 4-5 times per night to urinate.  He denies hematuria, dysuria, diarrhea, fatigue and urinary frequency.

## 2014-02-12 NOTE — Progress Notes (Signed)
  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 . . . . . . . The patient presents for routine under treatment assessment.                                   The patient is without complaint. He has significant nocturia but not out of his baseline. Patient relates this to diuretics.                                 Set-up films were reviewed.                                 The chart was checked. Physical Findings. . .  height is 5\' 7"  (1.702 m) and weight is 216 lb 12.8 oz (98.34 kg). His oral temperature is 98.6 F (37 C). His blood pressure is 140/82 and his pulse is 63. His respiration is 16. . The lungs are clear. The heart has a regular rhythm and rate. The abdomen is soft and nontender with normal bowel sounds. Impression . . . . . . . The patient is tolerating radiation. Plan . . . . . . . . . . . . Continue treatment as planned.  ________________________________   Blair Promise, PhD, MD

## 2014-02-13 ENCOUNTER — Ambulatory Visit
Admission: RE | Admit: 2014-02-13 | Discharge: 2014-02-13 | Disposition: A | Discharge status: Home / Self Care | Payer: Medicare Other | Source: Ambulatory Visit | Attending: Radiation Oncology | Admitting: Radiation Oncology

## 2014-02-13 DIAGNOSIS — Z510 Encounter for antineoplastic radiation therapy: Secondary | ICD-10-CM | POA: Diagnosis not present

## 2014-02-14 ENCOUNTER — Ambulatory Visit
Admission: RE | Admit: 2014-02-14 | Discharge: 2014-02-14 | Disposition: A | Discharge status: Home / Self Care | Payer: Medicare Other | Source: Ambulatory Visit | Attending: Radiation Oncology | Admitting: Radiation Oncology

## 2014-02-14 DIAGNOSIS — Z510 Encounter for antineoplastic radiation therapy: Secondary | ICD-10-CM | POA: Diagnosis not present

## 2014-02-15 ENCOUNTER — Ambulatory Visit
Admission: RE | Admit: 2014-02-15 | Discharge: 2014-02-15 | Disposition: A | Discharge status: Home / Self Care | Payer: Medicare Other | Source: Ambulatory Visit | Attending: Radiation Oncology | Admitting: Radiation Oncology

## 2014-02-15 DIAGNOSIS — Z510 Encounter for antineoplastic radiation therapy: Secondary | ICD-10-CM | POA: Diagnosis not present

## 2014-02-18 ENCOUNTER — Ambulatory Visit: Admission: RE | Admit: 2014-02-18 | Payer: Medicare Other | Source: Ambulatory Visit

## 2014-02-19 ENCOUNTER — Ambulatory Visit
Admission: RE | Admit: 2014-02-19 | Discharge: 2014-02-19 | Disposition: A | Discharge status: Home / Self Care | Payer: Medicare Other | Source: Ambulatory Visit | Attending: Radiation Oncology | Admitting: Radiation Oncology

## 2014-02-19 ENCOUNTER — Encounter: Payer: Self-pay | Admitting: Radiation Oncology

## 2014-02-19 VITALS — BP 184/90 | HR 63 | Temp 98.3°F | Resp 16 | Ht 67.0 in | Wt 215.1 lb

## 2014-02-19 DIAGNOSIS — Z510 Encounter for antineoplastic radiation therapy: Secondary | ICD-10-CM | POA: Diagnosis not present

## 2014-02-19 DIAGNOSIS — C61 Malignant neoplasm of prostate: Secondary | ICD-10-CM

## 2014-02-19 NOTE — Progress Notes (Signed)
  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 . . . . . . . The patient presents for routine under treatment assessment.                                   The patient is without complaint. He has nocturia 4-5 times per evening for which is his baseline. Patient denies any dysuria or bowel complaints. His energy level continues to be good.                                 Set-up films were reviewed.                                 The chart was checked. Physical Findings. . .  height is 5\' 7"  (1.702 m) and weight is 215 lb 1.6 oz (97.569 kg). His oral temperature is 98.3 F (36.8 C). His blood pressure is 184/90 and his pulse is 63. His respiration is 16. Marland Kitchen He lungs are clear. The heart has regular rhythm and rate. Abdomen is soft and nontender with normal bowel sounds Impression . . . . . . . The patient is tolerating radiation. Plan . . . . . . . . . . . . Continue treatment as planned.  ________________________________   Blair Promise, PhD, MD

## 2014-02-19 NOTE — Progress Notes (Signed)
Antonio Romero has completed 15 fractions to his prostate.  He denies pain, dysuria, hematuria, fatigue, diarrhea and an increase in urinary frequency.  He reports continuing to get up 4-5 times per night due to taking lasix.

## 2014-02-20 ENCOUNTER — Ambulatory Visit
Admission: RE | Admit: 2014-02-20 | Discharge: 2014-02-20 | Disposition: A | Discharge status: Home / Self Care | Payer: Medicare Other | Source: Ambulatory Visit | Attending: Radiation Oncology | Admitting: Radiation Oncology

## 2014-02-20 DIAGNOSIS — Z510 Encounter for antineoplastic radiation therapy: Secondary | ICD-10-CM | POA: Diagnosis not present

## 2014-02-21 ENCOUNTER — Ambulatory Visit
Admission: RE | Admit: 2014-02-21 | Discharge: 2014-02-21 | Disposition: A | Discharge status: Home / Self Care | Payer: Medicare Other | Source: Ambulatory Visit | Attending: Radiation Oncology | Admitting: Radiation Oncology

## 2014-02-21 DIAGNOSIS — Z510 Encounter for antineoplastic radiation therapy: Secondary | ICD-10-CM | POA: Diagnosis not present

## 2014-02-22 ENCOUNTER — Ambulatory Visit: Payer: Medicare Other

## 2014-02-22 ENCOUNTER — Ambulatory Visit
Admission: RE | Admit: 2014-02-22 | Discharge: 2014-02-22 | Disposition: A | Discharge status: Home / Self Care | Payer: Medicare Other | Source: Ambulatory Visit | Attending: Radiation Oncology | Admitting: Radiation Oncology

## 2014-02-22 DIAGNOSIS — Z510 Encounter for antineoplastic radiation therapy: Secondary | ICD-10-CM | POA: Diagnosis not present

## 2014-02-25 ENCOUNTER — Ambulatory Visit
Admission: RE | Admit: 2014-02-25 | Discharge: 2014-02-25 | Disposition: A | Discharge status: Home / Self Care | Payer: Medicare Other | Source: Ambulatory Visit | Attending: Radiation Oncology | Admitting: Radiation Oncology

## 2014-02-25 DIAGNOSIS — Z510 Encounter for antineoplastic radiation therapy: Secondary | ICD-10-CM | POA: Diagnosis not present

## 2014-02-26 ENCOUNTER — Ambulatory Visit: Payer: Medicare Other | Admitting: Radiation Oncology

## 2014-02-26 ENCOUNTER — Ambulatory Visit: Payer: Medicare Other

## 2014-02-27 ENCOUNTER — Ambulatory Visit
Admission: RE | Admit: 2014-02-27 | Discharge: 2014-02-27 | Disposition: A | Discharge status: Home / Self Care | Payer: Medicare Other | Source: Ambulatory Visit | Attending: Radiation Oncology | Admitting: Radiation Oncology

## 2014-02-27 ENCOUNTER — Encounter: Payer: Self-pay | Admitting: Radiation Oncology

## 2014-02-27 VITALS — BP 145/81 | HR 60 | Temp 98.8°F | Resp 20 | Ht 67.0 in | Wt 217.0 lb

## 2014-02-27 DIAGNOSIS — C61 Malignant neoplasm of prostate: Secondary | ICD-10-CM

## 2014-02-27 DIAGNOSIS — Z510 Encounter for antineoplastic radiation therapy: Secondary | ICD-10-CM | POA: Diagnosis not present

## 2014-02-27 NOTE — Progress Notes (Signed)
Antonio Romero has completed 19/38 fractions to his prostate.  He denies pain, dysuria, hematuria, diarrhea and fatigue.  He continues to get up 4-5 times per night to urinate.  He had an appointment with Dr. Tresa Moore on Monday and said his PSA was up to 0.42.

## 2014-02-27 NOTE — Progress Notes (Signed)
  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 . . . . . . . The patient presents for routine under treatment assessment.                                   The patient is without complaint.  He has nocturia 4-5 times per evening which is his baseline. Patient denies any dysuria loose bowels or diarrhea or fatigue. He does have urinary urgency during the day given a sample from his urologist to try. I've asked patient to bring his medications in for review.                                 Set-up films were reviewed.                                 The chart was checked. Physical Findings. . .  height is 5\' 7"  (1.702 m) and weight is 217 lb (98.431 kg). His oral temperature is 98.8 F (37.1 C). His blood pressure is 145/81 and his pulse is 60. His respiration is 20. . The lungs are clear. The heart has a regular rhythm and rate. The abdomen is soft and nontender with normal bowel sounds. Impression . . . . . . . The patient is tolerating radiation. Plan . . . . . . . . . . . . Continue treatment as planned.  ________________________________   Blair Promise, PhD, MD

## 2014-02-28 ENCOUNTER — Ambulatory Visit
Admission: RE | Admit: 2014-02-28 | Discharge: 2014-02-28 | Disposition: A | Discharge status: Home / Self Care | Payer: Medicare Other | Source: Ambulatory Visit | Attending: Radiation Oncology | Admitting: Radiation Oncology

## 2014-02-28 DIAGNOSIS — Z510 Encounter for antineoplastic radiation therapy: Secondary | ICD-10-CM | POA: Diagnosis not present

## 2014-03-01 ENCOUNTER — Telehealth: Payer: Self-pay | Admitting: Oncology

## 2014-03-01 ENCOUNTER — Ambulatory Visit
Admission: RE | Admit: 2014-03-01 | Discharge: 2014-03-01 | Disposition: A | Discharge status: Home / Self Care | Payer: Medicare Other | Source: Ambulatory Visit | Attending: Radiation Oncology | Admitting: Radiation Oncology

## 2014-03-01 DIAGNOSIS — Z510 Encounter for antineoplastic radiation therapy: Secondary | ICD-10-CM | POA: Diagnosis not present

## 2014-03-01 NOTE — Telephone Encounter (Signed)
Called Dr. Zettie Pho office and spoke to nursing triage regarding the sample of Uribel given to [facility] per Dr. Zettie Pho note on 02/25/14.  Advised the nurse that Amber would like a prescription of this and was wondering how they prescribed it.  The nurse said to have [place] call them and they will call the prescription in.  [facility] and asked if he can call Dr. Zettie Pho office and ask for nursing triage.  Gave him the phone number.  Laurice said he would call them today.

## 2014-03-04 ENCOUNTER — Ambulatory Visit
Admission: RE | Admit: 2014-03-04 | Discharge: 2014-03-04 | Disposition: A | Discharge status: Home / Self Care | Payer: Medicare Other | Source: Ambulatory Visit | Attending: Radiation Oncology | Admitting: Radiation Oncology

## 2014-03-04 DIAGNOSIS — Z510 Encounter for antineoplastic radiation therapy: Secondary | ICD-10-CM | POA: Diagnosis not present

## 2014-03-05 ENCOUNTER — Inpatient Hospital Stay
Admission: RE | Admit: 2014-03-05 | Discharge: 2014-03-05 | Disposition: A | Discharge status: Home / Self Care | Payer: Self-pay | Source: Ambulatory Visit | Attending: Radiation Oncology | Admitting: Radiation Oncology

## 2014-03-05 ENCOUNTER — Ambulatory Visit
Admission: RE | Admit: 2014-03-05 | Discharge: 2014-03-05 | Disposition: A | Discharge status: Home / Self Care | Payer: Medicare Other | Source: Ambulatory Visit | Attending: Radiation Oncology | Admitting: Radiation Oncology

## 2014-03-05 VITALS — BP 158/85 | HR 69 | Temp 99.0°F | Resp 14 | Ht 67.0 in | Wt 216.7 lb

## 2014-03-05 DIAGNOSIS — C61 Malignant neoplasm of prostate: Secondary | ICD-10-CM

## 2014-03-05 DIAGNOSIS — Z510 Encounter for antineoplastic radiation therapy: Secondary | ICD-10-CM | POA: Diagnosis not present

## 2014-03-05 NOTE — Progress Notes (Signed)
Patient has completed 24 fractions to the prostate bed. He denies pain. Pt reports urinary frequency and nocturia x4-5 per night. Denies hematuria, diarrhea, or skin irritation. Pt started taking uribel once daily, making patient feel "achy"

## 2014-03-05 NOTE — Progress Notes (Signed)
  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 . . . . . . . The patient presents for routine under treatment assessment.                                   The patient does have some urinary frequency and urgency. He is on uribel which is helpful for this issue. He takes approximately 1-2 per day. He denies any bowel complaints or dysuria                                 Set-up films were reviewed.                                 The chart was checked. Physical Findings. . .  height is 5\' 7"  (1.702 m) and weight is 216 lb 11.2 oz (98.294 kg). His oral temperature is 99 F (37.2 C). His blood pressure is 158/85 and his pulse is 69. His respiration is 14. . Weight essentially stable.  No significant changes. Impression . . . . . . . The patient is tolerating radiation. Plan . . . . . . . . . . . . Continue treatment as planned.  ________________________________   Blair Promise, PhD, MD

## 2014-03-06 ENCOUNTER — Ambulatory Visit
Admission: RE | Admit: 2014-03-06 | Discharge: 2014-03-06 | Disposition: A | Discharge status: Home / Self Care | Payer: Medicare Other | Source: Ambulatory Visit | Attending: Radiation Oncology | Admitting: Radiation Oncology

## 2014-03-06 DIAGNOSIS — Z510 Encounter for antineoplastic radiation therapy: Secondary | ICD-10-CM | POA: Diagnosis not present

## 2014-03-07 ENCOUNTER — Ambulatory Visit
Admission: RE | Admit: 2014-03-07 | Discharge: 2014-03-07 | Disposition: A | Discharge status: Home / Self Care | Payer: Medicare Other | Source: Ambulatory Visit | Attending: Radiation Oncology | Admitting: Radiation Oncology

## 2014-03-07 DIAGNOSIS — Z510 Encounter for antineoplastic radiation therapy: Secondary | ICD-10-CM | POA: Diagnosis not present

## 2014-03-08 ENCOUNTER — Ambulatory Visit
Admission: RE | Admit: 2014-03-08 | Discharge: 2014-03-08 | Disposition: A | Discharge status: Home / Self Care | Payer: Medicare Other | Source: Ambulatory Visit | Attending: Radiation Oncology | Admitting: Radiation Oncology

## 2014-03-08 DIAGNOSIS — Z510 Encounter for antineoplastic radiation therapy: Secondary | ICD-10-CM | POA: Diagnosis not present

## 2014-03-11 ENCOUNTER — Ambulatory Visit
Admission: RE | Admit: 2014-03-11 | Discharge: 2014-03-11 | Disposition: A | Discharge status: Home / Self Care | Payer: Medicare Other | Source: Ambulatory Visit | Attending: Radiation Oncology | Admitting: Radiation Oncology

## 2014-03-11 DIAGNOSIS — Z510 Encounter for antineoplastic radiation therapy: Secondary | ICD-10-CM | POA: Diagnosis not present

## 2014-03-12 ENCOUNTER — Ambulatory Visit
Admission: RE | Admit: 2014-03-12 | Discharge: 2014-03-12 | Disposition: A | Discharge status: Home / Self Care | Payer: Medicare Other | Source: Ambulatory Visit | Attending: Radiation Oncology | Admitting: Radiation Oncology

## 2014-03-12 ENCOUNTER — Encounter: Payer: Self-pay | Admitting: Radiation Oncology

## 2014-03-12 VITALS — BP 138/82 | HR 64 | Temp 98.3°F | Resp 20 | Ht 67.0 in | Wt 213.7 lb

## 2014-03-12 DIAGNOSIS — Z510 Encounter for antineoplastic radiation therapy: Secondary | ICD-10-CM | POA: Diagnosis not present

## 2014-03-12 DIAGNOSIS — C61 Malignant neoplasm of prostate: Secondary | ICD-10-CM

## 2014-03-12 NOTE — Progress Notes (Signed)
  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 . . . . . . . The patient presents for routine under treatment assessment.                                   The patient has a lot of urinary frequency at night but does not wish to try an additional pill. He continues to take uribel once daily he denies any dysuria or bowel problems.                                 Set-up films were reviewed.                                 The chart was checked. Physical Findings. . .  height is 5\' 7"  (1.702 m) and weight is 213 lb 11.2 oz (96.934 kg). His oral temperature is 98.3 F (36.8 C). His blood pressure is 138/82 and his pulse is 64. His respiration is 20. . The lungs are clear. The heart has a regular rhythm and rate. The abdomen is soft and nontender with normal bowel sounds. Impression . . . . . . . The patient is tolerating radiation. Plan . . . . . . . . . . . . Continue treatment as planned.  ________________________________   Blair Promise, PhD, MD

## 2014-03-12 NOTE — Progress Notes (Signed)
Antonio Romero has completed 29 fractions to his prostate.  He denies pain except for leg cramps at night.  He reports urinary frequency and has cut his fluid pill in half during the week.  He is taking a full tablet on the weekends.  He continues to take 1 uribel tablet per day.  He reports getting up 8 times per night to urinate.  He denies diarrhea and skin irration.  He reports fatigue.

## 2014-03-13 ENCOUNTER — Ambulatory Visit
Admission: RE | Admit: 2014-03-13 | Discharge: 2014-03-13 | Disposition: A | Discharge status: Home / Self Care | Payer: Medicare Other | Source: Ambulatory Visit | Attending: Radiation Oncology | Admitting: Radiation Oncology

## 2014-03-13 DIAGNOSIS — Z510 Encounter for antineoplastic radiation therapy: Secondary | ICD-10-CM | POA: Diagnosis not present

## 2014-03-14 ENCOUNTER — Ambulatory Visit
Admission: RE | Admit: 2014-03-14 | Discharge: 2014-03-14 | Disposition: A | Discharge status: Home / Self Care | Payer: Medicare Other | Source: Ambulatory Visit | Attending: Radiation Oncology | Admitting: Radiation Oncology

## 2014-03-14 DIAGNOSIS — Z510 Encounter for antineoplastic radiation therapy: Secondary | ICD-10-CM | POA: Diagnosis not present

## 2014-03-15 ENCOUNTER — Ambulatory Visit
Admission: RE | Admit: 2014-03-15 | Discharge: 2014-03-15 | Disposition: A | Discharge status: Home / Self Care | Payer: Medicare Other | Source: Ambulatory Visit | Attending: Radiation Oncology | Admitting: Radiation Oncology

## 2014-03-15 DIAGNOSIS — Z510 Encounter for antineoplastic radiation therapy: Secondary | ICD-10-CM | POA: Diagnosis not present

## 2014-03-18 ENCOUNTER — Ambulatory Visit
Admission: RE | Admit: 2014-03-18 | Discharge: 2014-03-18 | Disposition: A | Discharge status: Home / Self Care | Payer: Medicare Other | Source: Ambulatory Visit | Attending: Radiation Oncology | Admitting: Radiation Oncology

## 2014-03-18 DIAGNOSIS — Z510 Encounter for antineoplastic radiation therapy: Secondary | ICD-10-CM | POA: Diagnosis not present

## 2014-03-19 ENCOUNTER — Ambulatory Visit
Admission: RE | Admit: 2014-03-19 | Discharge: 2014-03-19 | Disposition: A | Discharge status: Home / Self Care | Payer: Medicare Other | Source: Ambulatory Visit | Attending: Radiation Oncology | Admitting: Radiation Oncology

## 2014-03-19 DIAGNOSIS — Z510 Encounter for antineoplastic radiation therapy: Secondary | ICD-10-CM | POA: Diagnosis not present

## 2014-03-20 ENCOUNTER — Ambulatory Visit
Admission: RE | Admit: 2014-03-20 | Discharge: 2014-03-20 | Disposition: A | Discharge status: Home / Self Care | Payer: Medicare Other | Source: Ambulatory Visit | Attending: Radiation Oncology | Admitting: Radiation Oncology

## 2014-03-20 ENCOUNTER — Ambulatory Visit: Payer: Medicare Other

## 2014-03-20 VITALS — BP 144/68 | HR 65 | Temp 98.0°F | Resp 16 | Ht 67.0 in | Wt 215.5 lb

## 2014-03-20 DIAGNOSIS — C61 Malignant neoplasm of prostate: Secondary | ICD-10-CM

## 2014-03-20 DIAGNOSIS — Z510 Encounter for antineoplastic radiation therapy: Secondary | ICD-10-CM | POA: Diagnosis not present

## 2014-03-20 NOTE — Progress Notes (Signed)
Antonio Romero has completed 35 fractions to his prostate.  He denies pain.  He feels really tired today.  He denies an increase in urinary frequency, dysuria, hematuria, skin irritation and diarrhea.  He reports he is not getting up as much to urinate at night.  He is down to 4 times a night.  He continues to take Uribel PRN which turns his urine turquoise.

## 2014-03-20 NOTE — Progress Notes (Signed)
  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 . . . . . . . The patient presents for routine under treatment assessment.                                   The patient is without complaint except he has noticed more fatigue. He continues to have urinary frequency and nocturia but nothing out of the ordinary. He denies any hematuria. Denies any bowel problems. He continues take Uribel once a day.                                 Set-up films were reviewed.                                 The chart was checked. Physical Findings. . .  height is 5\' 7"  (1.702 m) and weight is 215 lb 8 oz (97.75 kg). His oral temperature is 98 F (36.7 C). His blood pressure is 144/68 and his pulse is 65. His respiration is 16. . The abdomen is soft and nontender with normal bowel sounds. Impression . . . . . . . The patient is tolerating radiation. Plan . . . . . . . . . . . . Continue treatment as planned.  ________________________________   Blair Promise, PhD, MD

## 2014-03-21 ENCOUNTER — Ambulatory Visit
Admission: RE | Admit: 2014-03-21 | Discharge: 2014-03-21 | Disposition: A | Discharge status: Home / Self Care | Payer: Medicare Other | Source: Ambulatory Visit | Attending: Radiation Oncology | Admitting: Radiation Oncology

## 2014-03-21 ENCOUNTER — Ambulatory Visit: Payer: Medicare Other

## 2014-03-21 DIAGNOSIS — Z510 Encounter for antineoplastic radiation therapy: Secondary | ICD-10-CM | POA: Diagnosis not present

## 2014-03-22 ENCOUNTER — Ambulatory Visit: Payer: Medicare Other

## 2014-03-22 DIAGNOSIS — Z510 Encounter for antineoplastic radiation therapy: Secondary | ICD-10-CM | POA: Diagnosis not present

## 2014-03-25 ENCOUNTER — Ambulatory Visit: Payer: Medicare Other

## 2014-03-25 ENCOUNTER — Encounter: Payer: Self-pay | Admitting: Radiation Oncology

## 2014-03-25 ENCOUNTER — Inpatient Hospital Stay
Admission: RE | Admit: 2014-03-25 | Discharge: 2014-03-25 | Disposition: A | Discharge status: Home / Self Care | Payer: Self-pay | Source: Ambulatory Visit | Attending: Radiation Oncology | Admitting: Radiation Oncology

## 2014-03-25 VITALS — BP 159/81 | HR 66 | Temp 98.0°F | Resp 16 | Ht 67.0 in | Wt 216.9 lb

## 2014-03-25 DIAGNOSIS — Z510 Encounter for antineoplastic radiation therapy: Secondary | ICD-10-CM | POA: Diagnosis not present

## 2014-03-25 DIAGNOSIS — C61 Malignant neoplasm of prostate: Secondary | ICD-10-CM

## 2014-03-25 NOTE — Progress Notes (Signed)
Weekly Management Note:  Site:prostate bed Current Dose:  6840  cGy Projected Dose: 6840  cGy  Narrative: The patient is seen today for routine under treatment assessment. CBCT/MVCT images/port films were reviewed. The chart was reviewed.   Bladder filling appears to be satisfactory. No new GU or GI difficulties. He has been on Uribel  and he plans on discontinuing this after his final treatment today.  No new GU or GI difficulties.  Physical Examination:  Filed Vitals:   03/25/14 1531  BP: 159/81  Pulse: 66  Temp: 98 F (36.7 C)  Resp: 16  .  Weight: 216 lb 14.4 oz (98.385 kg). No change.  Impression: Radiation therapy completed.  Plan: Follow-up visit with Dr. Sondra Come in one month.

## 2014-03-25 NOTE — Progress Notes (Signed)
Antonio Romero has completed treatment to his prostate with 38 fractions.  He denies pain.  He reports his urinary frequency is the same as last week.  He continues to take uribel once a day.  He reports getting up 4 times per night to urinate.  He denies dysuria, hematuria and diarrhea.  He reports feeling week/fatigued last Wednesday.  He felt better over the weekend.

## 2014-04-08 ENCOUNTER — Encounter: Payer: Self-pay | Admitting: Radiation Oncology

## 2014-04-08 NOTE — Progress Notes (Signed)
  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   He had minimal urinary symptoms during the course of treatment as well as some mild fatigue.  Plan: The patient has completed radiation treatment. The patient will return to radiation oncology clinic for routine followup in one month. I advised them to call or return sooner if they have any questions or concerns related to their recovery or treatment.  -----------------------------------  Blair Promise, PhD, MD

## 2014-04-24 ENCOUNTER — Encounter: Payer: Self-pay | Admitting: Oncology

## 2014-04-24 ENCOUNTER — Telehealth: Payer: Self-pay | Admitting: Oncology

## 2014-04-25 ENCOUNTER — Encounter: Payer: Self-pay | Admitting: Radiation Oncology

## 2014-04-25 ENCOUNTER — Ambulatory Visit
Admission: RE | Admit: 2014-04-25 | Discharge: 2014-04-25 | Disposition: A | Discharge status: Home / Self Care | Payer: Medicare Other | Source: Ambulatory Visit | Attending: Radiation Oncology | Admitting: Radiation Oncology

## 2014-04-25 VITALS — BP 157/70 | HR 58 | Temp 97.7°F | Resp 20 | Ht 67.0 in | Wt 217.6 lb

## 2014-04-25 DIAGNOSIS — C61 Malignant neoplasm of prostate: Secondary | ICD-10-CM

## 2014-04-25 NOTE — Progress Notes (Signed)
  Radiation Oncology         [phone]) [phone] ________________________________  Name: Antonio Romero MRN: [id number]  Date: 04/25/2014  DOB: 1946/03/10  Follow-Up Visit Note  CC: Velna Hatchet, MD  Alexis Frock, MD    ICD-9-CM ICD-10-CM   1. 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  He continues to have some fatigue but this issue is improving. His urinary symptoms are improving and close to baseline. He gets up anywhere from 2-6 times at night. His urinary stream is stronger. He denies any hematuria. Patient is very mild blood on the tissue after wiping but no significant  rectal bleeding. He denies any pain with bowel movements or dysuria.                              ALLERGIES:  is allergic to nitroglycerin.  Meds: Current Outpatient Prescriptions  Medication Sig Dispense Refill  . amLODipine (NORVASC) 10 MG tablet Take 1 tablet by mouth every morning.     Marland Kitchen aspirin EC 81 MG tablet Take 81 mg by mouth daily.    . furosemide (LASIX) 40 MG tablet Take 1 tablet by mouth every morning.     . lansoprazole (PREVACID) 30 MG capsule Take 30 mg by mouth daily.    Marland Kitchen losartan (COZAAR) 100 MG tablet Take 1 tablet by mouth every morning.     . metoprolol succinate (TOPROL-XL) 100 MG 24 hr tablet Take 1 tablet by mouth every morning.     . potassium chloride SA (K-DUR,KLOR-CON) 20 MEQ tablet Take 1 tablet by mouth Daily.    . Meth-Hyo-M Bl-Na Phos-Ph Sal (URIBEL PO) Take 1 capsule by mouth daily.     No current facility-administered medications for this encounter.    Physical Findings: The patient is in no acute distress. Patient is alert and oriented.  height is 5\' 7"  (1.702 m) and weight is 217 lb 9.6 oz (98.703 kg). His oral temperature is 97.7 F (36.5 C). His blood pressure is 157/70 and his pulse is 58. His  respiration is 20. .  The lungs are clear. The heart has a regular rhythm and rate. The abdomen is soft and nontender with normal bowel sounds.  Lab Findings: Lab Results  Component Value Date   WBC 6.0 08/16/2013   HGB 13.5 08/25/2013   HCT 40.1 08/25/2013   MCV 89.1 08/16/2013   PLT 146* 08/16/2013    Radiographic Findings: No results found.  Impression:  The patient is recovering from the effects of radiation.    Plan:  The patient will followup with urology in February for his first post radiation PSA. Routine followup in radiation oncology in 6 months.  ____________________________________ Blair Promise, MD

## 2014-04-25 NOTE — Progress Notes (Signed)
Antonio Romero here for follow up after treatment to his prostate.  He reports cramping in his lower legs at night.  He reports that he fell last night because of them.  He continues to take lasix and potassium.  He reports having urinary frequency that comes and goes.  He has stopped taking Uribel.  He denies dysuria and hematuria. He reports getting up at night to urinate anywhere from 2 - 6 times.  He reports his urinary stream is stronger.  He reports fatigue and takes naps in the afternoon.

## 2014-09-03 ENCOUNTER — Telehealth: Payer: Self-pay | Admitting: Cardiology

## 2014-09-03 NOTE — Telephone Encounter (Signed)
Mrs. Awan called back and states that they got the doctor's mixed up and he doesn't need lab work for Dr. Percival Spanish

## 2014-09-03 NOTE — Telephone Encounter (Signed)
Mr. Arora is needing a lab order sent to him before his appt on 11/29/2014. Thanks

## 2014-10-10 ENCOUNTER — Ambulatory Visit: Payer: Medicare Other | Admitting: Radiation Oncology

## 2014-10-23 ENCOUNTER — Telehealth: Payer: Self-pay | Admitting: Cardiology

## 2014-10-23 ENCOUNTER — Emergency Department (HOSPITAL_COMMUNITY)
Admission: EM | Admit: 2014-10-23 | Discharge: 2014-10-23 | Disposition: A | Discharge status: Home / Self Care | Payer: Medicare Other | Attending: Emergency Medicine | Admitting: Emergency Medicine

## 2014-10-23 ENCOUNTER — Emergency Department (HOSPITAL_COMMUNITY): Payer: Medicare Other

## 2014-10-23 DIAGNOSIS — I2510 Atherosclerotic heart disease of native coronary artery without angina pectoris: Secondary | ICD-10-CM | POA: Insufficient documentation

## 2014-10-23 DIAGNOSIS — Z7982 Long term (current) use of aspirin: Secondary | ICD-10-CM | POA: Diagnosis not present

## 2014-10-23 DIAGNOSIS — I480 Paroxysmal atrial fibrillation: Secondary | ICD-10-CM | POA: Diagnosis not present

## 2014-10-23 DIAGNOSIS — Z8774 Personal history of (corrected) congenital malformations of heart and circulatory system: Secondary | ICD-10-CM | POA: Diagnosis not present

## 2014-10-23 DIAGNOSIS — R531 Weakness: Secondary | ICD-10-CM | POA: Insufficient documentation

## 2014-10-23 DIAGNOSIS — Z79899 Other long term (current) drug therapy: Secondary | ICD-10-CM | POA: Insufficient documentation

## 2014-10-23 DIAGNOSIS — Z86718 Personal history of other venous thrombosis and embolism: Secondary | ICD-10-CM | POA: Diagnosis not present

## 2014-10-23 DIAGNOSIS — I493 Ventricular premature depolarization: Secondary | ICD-10-CM | POA: Diagnosis not present

## 2014-10-23 DIAGNOSIS — Z923 Personal history of irradiation: Secondary | ICD-10-CM | POA: Insufficient documentation

## 2014-10-23 DIAGNOSIS — R079 Chest pain, unspecified: Secondary | ICD-10-CM | POA: Diagnosis present

## 2014-10-23 DIAGNOSIS — I10 Essential (primary) hypertension: Secondary | ICD-10-CM | POA: Insufficient documentation

## 2014-10-23 DIAGNOSIS — Z87442 Personal history of urinary calculi: Secondary | ICD-10-CM | POA: Diagnosis not present

## 2014-10-23 DIAGNOSIS — Z954 Presence of other heart-valve replacement: Secondary | ICD-10-CM | POA: Diagnosis not present

## 2014-10-23 DIAGNOSIS — Z8546 Personal history of malignant neoplasm of prostate: Secondary | ICD-10-CM | POA: Insufficient documentation

## 2014-10-23 DIAGNOSIS — Z862 Personal history of diseases of the blood and blood-forming organs and certain disorders involving the immune mechanism: Secondary | ICD-10-CM | POA: Insufficient documentation

## 2014-10-23 DIAGNOSIS — R0602 Shortness of breath: Secondary | ICD-10-CM

## 2014-10-23 LAB — CBC
HCT: 43.2 % (ref 39.0–52.0)
Hemoglobin: 14.8 g/dL (ref 13.0–17.0)
MCH: 30 pg (ref 26.0–34.0)
MCHC: 34.3 g/dL (ref 30.0–36.0)
MCV: 87.4 fL (ref 78.0–100.0)
Platelets: 157 10*3/uL (ref 150–400)
RBC: 4.94 MIL/uL (ref 4.22–5.81)
RDW: 13.9 % (ref 11.5–15.5)
WBC: 7.5 10*3/uL (ref 4.0–10.5)

## 2014-10-23 LAB — BASIC METABOLIC PANEL
Anion gap: 10 (ref 5–15)
BUN: 17 mg/dL (ref 6–20)
CO2: 22 mmol/L (ref 22–32)
Calcium: 9.1 mg/dL (ref 8.9–10.3)
Chloride: 106 mmol/L (ref 101–111)
Creatinine, Ser: 0.89 mg/dL (ref 0.61–1.24)
GFR calc Af Amer: >60 mL/min (ref >60)
GFR calc non Af Amer: >60 mL/min (ref >60)
Glucose, Bld: 156 mg/dL — ABNORMAL HIGH (ref 65–99)
Potassium: 4 mmol/L (ref 3.5–5.1)
Sodium: 138 mmol/L (ref 135–145)

## 2014-10-23 LAB — I-STAT TROPONIN, ED: Troponin i, poc: 0.01 ng/mL (ref 0.00–0.08)

## 2014-10-23 LAB — MAGNESIUM: Magnesium: 1.9 mg/dL (ref 1.7–2.4)

## 2014-10-23 LAB — TSH: TSH: 1.395 u[IU]/mL (ref 0.350–4.500)

## 2014-10-23 MED ORDER — SODIUM CHLORIDE 0.9 % IV BOLUS (SEPSIS)
500.0000 mL | Freq: Once | INTRAVENOUS | Status: AC
  Administered 2014-10-23: 500 mL via INTRAVENOUS

## 2014-10-23 MED ORDER — METOPROLOL TARTRATE 1 MG/ML IV SOLN
5.0000 mg | Freq: Once | INTRAVENOUS | Status: AC
  Administered 2014-10-23: 5 mg via INTRAVENOUS
  Filled 2014-10-23: qty 5

## 2014-10-23 MED ORDER — METOPROLOL TARTRATE 25 MG PO TABS: 50.0000 mg | ORAL_TABLET | Freq: Once | ORAL | Status: DC | PRN

## 2014-10-23 NOTE — ED Provider Notes (Deleted)
CSN: [id number]     Arrival date & time 10/23/14  7829 History   First MD Initiated Contact with Patient 10/23/14 [phone]     Chief Complaint  Patient presents with  . Chest Pain      HPI  Patient awakened at approximately 2 AM. He was aware of some palpitations this chest. States it feels different than his history of A. Fib.  He states it makes him feel aware of his breathing and mildly short of breath. States she does feel mildly weak. No chest pain or pressure. He mowed the yard yesterday and was outside for several hours. States she was drinking water but did have some leg cramps to the night as well. No change in medications.  Past Medical History  Diagnosis Date  . Coronary artery disease     a. Cath 2003 nl cors;  b. Cath 2011 nonobs dzs;  c.  10/2011 Cath: LAD mod prox dzs, mild LCX & RCA dzs.  . Bicuspid aortic valve     a. 09/2011 Echo: EF 45-50%, Bicusp AoV with mild-mod AI and Sev AS. Mod-sev dil LA, mild-mod MR;  b. 10/2011 Bioprosthetic AVR - Carpentier-Edwards #25;  c. 10/2011 post-op echo EF 52%, AoV prosthesis w/ 50mmHg peak and 64mmHg mean gradietn, Gr2 DD.  Marland Kitchen DVT (deep venous thrombosis)     a. following back surgery in past.  . Coumadin resistance     a. pt reports labile INR in past.  . PAF (paroxysmal atrial fibrillation)     a. s/p DCCV in 2011;  b. s/p Pulm Vein isolation (AtriCure) & LAA clipping (AtriClip) @ time of AVR 10/2011;  c. CHADS2=1  . HTN (hypertension)   . Complication of anesthesia     NAUSEA  . H/O aortic valve replacement 2013  . Dysrhythmia     HX A FIB - ABLATION 2013 - RESOLVED  . Prostate cancer   . History of kidney stones   . Radiation 01/29/14 - 03/25/14    prostate bed 68.4 gray   Past Surgical History  Procedure Laterality Date  . Aortic valve replacement  2013  . Back surgery      Multiple procedures reported in the records  . Robot assisted laparoscopic radical prostatectomy N/A 08/24/2013    Procedure: ROBOTIC ASSISTED LAPAROSCOPIC  RADICAL PROSTATECTOMY WITH INDOCYANINE GREEN DYE;  Surgeon: Alexis Frock, MD;  Location: [facility];  Service: Urology;  Laterality: N/A;  . Lymphadenectomy Bilateral 08/24/2013    Procedure: LYMPHADENECTOMY ;  Surgeon: Alexis Frock, MD;  Location: [facility];  Service: Urology;  Laterality: Bilateral;   Family History  Problem Relation Age of Onset  . Colon cancer Brother    History  Substance Use Topics  . Smoking status: Never Smoker   . Smokeless tobacco: Never Used  . Alcohol Use: No    Review of Systems  Constitutional: Negative for fever, chills, diaphoresis, appetite change and fatigue.  HENT: Negative for mouth sores, sore throat and trouble swallowing.   Eyes: Negative for visual disturbance.  Respiratory: Positive for shortness of breath. Negative for cough, chest tightness and wheezing.   Cardiovascular: Positive for palpitations. Negative for chest pain.  Gastrointestinal: Negative for nausea, vomiting, abdominal pain, diarrhea and abdominal distention.  Endocrine: Negative for polydipsia, polyphagia and polyuria.  Genitourinary: Negative for dysuria, frequency and hematuria.  Musculoskeletal: Negative for gait problem.  Skin: Negative for color change, pallor and rash.  Neurological: Positive for weakness. Negative for dizziness, syncope, light-headedness and headaches.  Hematological:  Does not bruise/bleed easily.  Psychiatric/Behavioral: Negative for behavioral problems and confusion.      Allergies  Nitroglycerin  Home Medications   Prior to Admission medications   Medication Sig Start Date End Date Taking? Authorizing Provider  amLODipine (NORVASC) 10 MG tablet Take 1 tablet by mouth every morning.  10/07/11   Historical Provider, MD  aspirin EC 81 MG tablet Take 81 mg by mouth daily.    Historical Provider, MD  furosemide (LASIX) 40 MG tablet Take 1 tablet by mouth every morning.  10/07/11   Historical Provider, MD  lansoprazole (PREVACID) 30 MG capsule Take 30  mg by mouth daily.    Historical Provider, MD  losartan (COZAAR) 100 MG tablet Take 1 tablet by mouth every morning.  09/22/11   Historical Provider, MD  Meth-Hyo-M Barnett Hatter Phos-Ph Sal (URIBEL PO) Take 1 capsule by mouth daily.    Historical Provider, MD  metoprolol succinate (TOPROL-XL) 100 MG 24 hr tablet Take 1 tablet by mouth every morning.  10/07/11   Historical Provider, MD  potassium chloride SA (K-DUR,KLOR-CON) 20 MEQ tablet Take 1 tablet by mouth Daily. 10/07/11   Historical Provider, MD   BP 155/85 mmHg  Pulse 75  Temp(Src) 97.9 F (36.6 C) (Oral)  Resp 18  Ht 5\' 7"  (1.702 m)  Wt 215 lb (97.523 kg)  BMI 33.67 kg/m2  SpO2 97% Physical Exam  Constitutional: He is oriented to person, place, and time. He appears well-developed and well-nourished. No distress.  HENT:  Head: Normocephalic.  Eyes: Conjunctivae are normal. Pupils are equal, round, and reactive to light. No scleral icterus.  Neck: Normal range of motion. Neck supple. No thyromegaly present.  Cardiovascular: Normal rate.  An irregular rhythm present. Exam reveals no gallop and no friction rub.   No murmur heard. Sinus rhythm with occasional PVCs on monitor. Unifocal. No organized ectopy.  Pulmonary/Chest: Effort normal and breath sounds normal. No respiratory distress. He has no wheezes. He has no rales.  Abdominal: Soft. Bowel sounds are normal. He exhibits no distension. There is no tenderness. There is no rebound.  Musculoskeletal: Normal range of motion.  Neurological: He is alert and oriented to person, place, and time.  Skin: Skin is warm and dry. No rash noted.  Psychiatric: He has a normal mood and affect. His behavior is normal.    ED Course  Procedures (including critical care time) Labs Review Labs Reviewed  BASIC METABOLIC PANEL  CBC  MAGNESIUM  TSH  I-STAT TROPOININ, ED    Imaging Review Dg Chest 2 View  10/23/2014   CLINICAL DATA:  Shortness of Breath  EXAM: CHEST  2 VIEW  COMPARISON:  August 16, 2013  FINDINGS: There is no edema or consolidation. The heart is mildly enlarged with pulmonary vascularity within normal limits. No adenopathy. Patient is status post aortic valve replacement. There is a left atrial appendage clamp. There is postoperative change in the lower cervical spine as well as in the lower thoracic and upper lumbar spine regions.  IMPRESSION: No edema or consolidation.  No change in cardiac silhouette.   Electronically Signed   By: Lowella Grip III M.D.   On: 10/23/2014 07:23     EKG Interpretation None      MDM   Final diagnoses:  SOB (shortness of breath)      Patient is asymptomatic now. Having infrequent PVCs. No additional symptoms. Think is appropriate for outpatient treatment and follow-up. S1 call his cardiologist. No symptoms or findings of suggest  that this is paroxysmal A. fib. However, he may need to wear a Holter monitor of some point. Prescription for a short acting metoprolol for symptoms at night. Avoid caffeine. Rest and stay hydrated. Return with any worsening symptoms  Tanna Furry, MD 10/25/14 [phone]

## 2014-10-23 NOTE — ED Notes (Signed)
Pt ambulated to the restroom with ease.

## 2014-10-23 NOTE — Discharge Instructions (Signed)
Premature Ventricular Contraction Premature ventricular contraction (PVC) is an irregularity of the heart rhythm involving extra or skipped heartbeats. In some cases, they may occur without obvious cause or heart disease. Other times, they can be caused by an electrolyte change in the blood. These need to be corrected. They can also be seen when there is not enough oxygen going to the heart. A common cause of this is plaque or cholesterol buildup. This buildup decreases the blood supply to the heart. In addition, extra beats may be caused or aggravated by:  Excessive smoking.  Alcohol consumption.  Caffeine.  Certain medications  Some street drugs. SYMPTOMS   The sensation of feeling your heart skipping a beat (palpitations).  In many cases, the person may have no symptoms. SIGNS AND TESTS   A physical examination may show an occasional irregularity, but if the PVC beats do not happen often, they may not be found on physical exam.  Blood pressure is usually normal.  Other tests that may find extra beats of the heart are:  An EKG (electrocardiogram)  A Holter monitor which can monitor your heart over longer periods of time  An Angiogram (study of the heart arteries). TREATMENT  Usually extra heartbeats do not need treatment. The condition is treated only if symptoms are severe or if extra beats are very frequent or are causing problems. An underlying cause, if discovered, may also require treatment.  Treatment may also be needed if there may be a risk for other more serious cardiac arrhythmias.  PREVENTION   Moderation in caffeine, alcohol, and tobacco use may reduce the risk of ectopic heartbeats in some people.  Exercise often helps people who lead a sedentary (inactive) lifestyle. PROGNOSIS  PVC heartbeats are generally harmless and do not need treatment.  RISKS AND COMPLICATIONS     There usually are no complications.  Other arrhythmias (occasionally). SEEK  IMMEDIATE MEDICAL CARE IF:   You feel palpitations that are frequent or continual.  You develop chest pain or other problems such as shortness of breath, sweating, or nausea and vomiting.  You become light-headed or faint (pass out).  You get worse or do not improve with treatment. Document Released: 12/12/2003 Document Revised: 07/19/2011 Document Reviewed: 06/23/2007 [facility] Patient Information 2015 [place], [place]. This information is not intended to replace advice given to you by your health care provider. Make sure you discuss any questions you have with your health care provider.

## 2014-10-23 NOTE — ED Provider Notes (Signed)
CSN: [id number]     Arrival date & time 10/23/14  3154 History   First MD Initiated Contact with Patient 10/23/14 [phone]     Chief Complaint  Patient presents with  . Chest Pain     (Consider location/radiation/quality/duration/timing/severity/associated sxs/prior Treatment) HPI  Past Medical History  Diagnosis Date  . Coronary artery disease     a. Cath 2003 nl cors;  b. Cath 2011 nonobs dzs;  c.  10/2011 Cath: LAD mod prox dzs, mild LCX & RCA dzs.  . Bicuspid aortic valve     a. 09/2011 Echo: EF 45-50%, Bicusp AoV with mild-mod AI and Sev AS. Mod-sev dil LA, mild-mod MR;  b. 10/2011 Bioprosthetic AVR - Carpentier-Edwards #25;  c. 10/2011 post-op echo EF 52%, AoV prosthesis w/ 22mmHg peak and 75mmHg mean gradietn, Gr2 DD.  Marland Kitchen DVT (deep venous thrombosis)     a. following back surgery in past.  . Coumadin resistance     a. pt reports labile INR in past.  . PAF (paroxysmal atrial fibrillation)     a. s/p DCCV in 2011;  b. s/p Pulm Vein isolation (AtriCure) & LAA clipping (AtriClip) @ time of AVR 10/2011;  c. CHADS2=1  . HTN (hypertension)   . Complication of anesthesia     NAUSEA  . H/O aortic valve replacement 2013  . Dysrhythmia     HX A FIB - ABLATION 2013 - RESOLVED  . Prostate cancer   . History of kidney stones   . Radiation 01/29/14 - 03/25/14    prostate bed 68.4 gray   Past Surgical History  Procedure Laterality Date  . Aortic valve replacement  2013  . Back surgery      Multiple procedures reported in the records  . Robot assisted laparoscopic radical prostatectomy N/A 08/24/2013    Procedure: ROBOTIC ASSISTED LAPAROSCOPIC RADICAL PROSTATECTOMY WITH INDOCYANINE GREEN DYE;  Surgeon: Alexis Frock, MD;  Location: [facility];  Service: Urology;  Laterality: N/A;  . Lymphadenectomy Bilateral 08/24/2013    Procedure: LYMPHADENECTOMY ;  Surgeon: Alexis Frock, MD;  Location: [facility];  Service: Urology;  Laterality: Bilateral;   Family History  Problem Relation Age of Onset  .  Colon cancer Brother    History  Substance Use Topics  . Smoking status: Never Smoker   . Smokeless tobacco: Never Used  . Alcohol Use: No    Review of Systems    Allergies  Nitroglycerin  Home Medications   Prior to Admission medications   Medication Sig Start Date End Date Taking? Authorizing Provider  amLODipine (NORVASC) 10 MG tablet Take 1 tablet by mouth every morning.  10/07/11   Historical Provider, MD  aspirin EC 81 MG tablet Take 81 mg by mouth daily.    Historical Provider, MD  furosemide (LASIX) 40 MG tablet Take 1 tablet by mouth every morning.  10/07/11   Historical Provider, MD  lansoprazole (PREVACID) 30 MG capsule Take 30 mg by mouth daily.    Historical Provider, MD  losartan (COZAAR) 100 MG tablet Take 1 tablet by mouth every morning.  09/22/11   Historical Provider, MD  Meth-Hyo-M Barnett Hatter Phos-Ph Sal (URIBEL PO) Take 1 capsule by mouth daily.    Historical Provider, MD  metoprolol succinate (TOPROL-XL) 100 MG 24 hr tablet Take 1 tablet by mouth every morning.  10/07/11   Historical Provider, MD  potassium chloride SA (K-DUR,KLOR-CON) 20 MEQ tablet Take 1 tablet by mouth Daily. 10/07/11   Historical Provider, MD   BP 139/76 mmHg  Pulse 63  Temp(Src) 97.9 F (36.6 C) (Oral)  Resp 18  Ht 5\' 7"  (1.702 m)  Wt 215 lb (97.523 kg)  BMI 33.67 kg/m2  SpO2 98% Physical Exam  ED Course  Procedures (including critical care time) Labs Review Labs Reviewed  BASIC METABOLIC PANEL  CBC  MAGNESIUM  TSH  I-STAT [place], ED    Imaging Review Dg Chest 2 View  10/23/2014   CLINICAL DATA:  Shortness of Breath  EXAM: CHEST  2 VIEW  COMPARISON:  August 16, 2013  FINDINGS: There is no edema or consolidation. The heart is mildly enlarged with pulmonary vascularity within normal limits. No adenopathy. Patient is status post aortic valve replacement. There is a left atrial appendage clamp. There is postoperative change in the lower cervical spine as well as in the lower thoracic  and upper lumbar spine regions.  IMPRESSION: No edema or consolidation.  No change in cardiac silhouette.   Electronically Signed   By: Lowella Grip III M.D.   On: 10/23/2014 07:23     EKG Interpretation None      MDM   Final diagnoses:  SOB (shortness of breath)        Tanna Furry, MD 11/03/14 [phone]

## 2014-10-23 NOTE — ED Notes (Signed)
States I couldn't sleep so I got up around 2 am.  Checked BP at that time and it was up 177/124.  States my chest doesn't really hurt it just feels like a fluttering.  Also c/o weakness.

## 2014-10-23 NOTE — Telephone Encounter (Signed)
Pt's wife called in stating that she took the pt to the ER today for SOB. She says that once they released him and sent him home he still had some SOB and PVC's. She would like for the pt to come in and be seen by Dr. Percival Spanish as soon as possible. Please call  Thanks

## 2014-10-23 NOTE — Telephone Encounter (Signed)
Spoke with pt wife, for the last 2 days the patient has been SOB, he woke at 1 am unable to breath and they went to the ER. The wife reports he has no swelling, or chest pain. He has been anxious and weak like he was before when he had atrial fib. The ER reported his EKG was fine, he was having PVC's. His bp has been running high at home for the last 2 days. He has a follow up appt 11-29-14 with hochrein. They are uncertain what to do.  Will forward for dr hochrein's review

## 2014-10-23 NOTE — Telephone Encounter (Signed)
Follow up on [facility] post ED.

## 2014-10-25 NOTE — Telephone Encounter (Signed)
Follow up scheduled

## 2014-11-07 NOTE — Progress Notes (Signed)
Cardiology Office Note   Date:  11/08/2014   ID:  [name], DOB May 12, 1945, MRN [id number]  PCP:  Velna Hatchet, MD  Cardiologist:  Dr. Minus Breeding     Chief Complaint  Patient presents with  . Palpitations    ED 10/23/14     History of Present Illness: Antonio Romero is a 69 y.o. male with a hx of nonobstructive CAD, bicuspid aortic valve with mild to moderate aortic insufficiency and severe aortic stenosis status post bioprosthetic AVR at [facility], [place]), PAF, HTN. Patient also underwent PVI ablation and LAA clipping at the time of his aortic valve surgery in [place]. He was taken off of Pradaxa 3 months post surgery. He has a history of prostate CA status post prostatectomy and radiation.  Last seen by Dr. Minus Breeding in 11/2013.  Seen in the emergency room 10/23/14 with palpitations. He was noted to have PVCs on telemetry. He returns for follow-up.  On the night that he went to the emergency room, he felt out of breath and felt his heartbeat was irregular. He was slightly nauseated. He denies vomiting. He does admit to heavy caffeine use. He has cut way back since going to the emergency room. He was given metoprolol tartrate to take as needed for palpitations. He only used this once. Since going to the emergency room, his palpitations are better. In the ER, TSH, potassium and magnesium were all normal. He denies chest discomfort. He denies dyspnea on exertion. He is NYHA 2. He denies orthopnea, PND. He has chronic postphlebitic edema. This is unchanged. He denies syncope or near-syncope. He has a chronic nonproductive cough.   Studies/Reports Reviewed Today:  Echo 10/18/12 - Mildconcentric hypertrophy. EF 50% to55%. There is hypokinesis of the basal inferoseptalmyocardium. - possibly post surgical. Grade 2 diastolicdysfunction.  - Aortic valve: A bioprosthesis was present - reported to be#25 Carpentier-Edwards. No obvious paravalvular leak.Measure  transvalvular gradients are somewhathigher thanthose described in the outside postoperative report. Nosignificant regurgitation. Mean gradient: 50mm Hg (S).Peak gradient: 95mm Hg (S). - Mitral valve: Calcified annulus. Mild regurgitation, twoseparate jets noted. - Left atrium: The atrium was moderately dilated. - Right atrium: The atrium was mildly dilated. - Tricuspid valve: Mild regurgitation. Peak RV-RA gradient:47mm Hg (S).  [facility] 09/12/09 CONCLUSIONS:  A. 10% stenosis of the proximal left main.  B. A 30% stenosis of the proximal and mid left anterior descending. There is 40-50% stenosis of the lower branch of a diagonal branch C. Luminal irregularities of the left circumflex.  D. Luminal irregularities of the right coronary artery.    Past Medical History  Diagnosis Date  . Coronary artery disease     a. Cath 2003 nl cors;  b. Cath 2011 nonobs dzs;  c.  10/2011 Cath: LAD mod prox dzs, mild LCX & RCA dzs.  . Bicuspid aortic valve     a. 09/2011 Echo: EF 45-50%, Bicusp AoV with mild-mod AI and Sev AS. Mod-sev dil LA, mild-mod MR;  b. 10/2011 Bioprosthetic AVR - Carpentier-Edwards #25;  c. 10/2011 post-op echo EF 52%, AoV prosthesis w/ 78mmHg peak and 54mmHg mean gradietn, Gr2 DD.  Marland Kitchen DVT (deep venous thrombosis)     a. following back surgery in past.  . Coumadin resistance     a. pt reports labile INR in past.  . PAF (paroxysmal atrial fibrillation)     a. s/p DCCV in 2011;  b. s/p Pulm Vein isolation (AtriCure) & LAA clipping (AtriClip) @ time of AVR 10/2011;  c. CHADS2=1  . HTN (hypertension)   . Complication of anesthesia     NAUSEA  . H/O aortic valve replacement 2013  . Dysrhythmia     HX A FIB - ABLATION 2013 - RESOLVED  . Prostate cancer   . History of kidney stones   . Radiation 01/29/14 - 03/25/14    prostate bed 68.4 gray    Past Surgical History  Procedure Laterality Date  . Aortic valve replacement  2013  . Back surgery      Multiple procedures reported in the  records  . Robot assisted laparoscopic radical prostatectomy N/A 08/24/2013    Procedure: ROBOTIC ASSISTED LAPAROSCOPIC RADICAL PROSTATECTOMY WITH INDOCYANINE GREEN DYE;  Surgeon: Alexis Frock, MD;  Location: [facility];  Service: Urology;  Laterality: N/A;  . Lymphadenectomy Bilateral 08/24/2013    Procedure: LYMPHADENECTOMY ;  Surgeon: Alexis Frock, MD;  Location: [facility];  Service: Urology;  Laterality: Bilateral;     Current Outpatient Prescriptions  Medication Sig Dispense Refill  . amLODipine (NORVASC) 10 MG tablet Take 1 tablet by mouth every morning.     Marland Kitchen aspirin EC 81 MG tablet Take 81 mg by mouth daily.    . furosemide (LASIX) 40 MG tablet Take 1 tablet by mouth every morning.     . lansoprazole (PREVACID) 30 MG capsule Take 30 mg by mouth daily.    Marland Kitchen losartan (COZAAR) 100 MG tablet Take 1 tablet by mouth every morning.     . potassium chloride SA (K-DUR,KLOR-CON) 20 MEQ tablet Take 1 tablet by mouth Daily.    . metoprolol succinate (TOPROL-XL) 50 MG 24 hr tablet Take 1 and 1/2 tabs twice daily = 75 mg twice daily; Take with or immediately following a meal. 90 tablet 3   No current facility-administered medications for this visit.    Allergies:   Nitroglycerin    Social History:  The patient  reports that he has never smoked. He has never used smokeless tobacco. He reports that he does not drink alcohol or use illicit drugs.   Family History:  The patient's family history includes Colon cancer in his brother; Heart attack in his father; Hypertension in his father. There is no history of Stroke.    ROS:   Please see the history of present illness.   Review of Systems  Cardiovascular: Positive for irregular heartbeat.  All other systems reviewed and are negative.    PHYSICAL EXAM: VS:  BP 140/82 mmHg  Pulse 65  Ht 5\' 7"  (1.702 m)  Wt 217 lb (98.431 kg)  BMI 33.98 kg/m2    Wt Readings from Last 3 Encounters:  11/08/14 217 lb (98.431 kg)  10/23/14 215 lb (97.523 kg)    04/25/14 217 lb 9.6 oz (98.703 kg)     GEN: Well nourished, well developed, in no acute distress HEENT: normal Neck: no JVD,  no masses Cardiac:  Normal U7/O5, RRR; 1-2 systolic murmur RUSB, no rubs or gallops, trace bilateral edema   Respiratory:  clear to auscultation bilaterally, no wheezing, rhonchi or rales. GI: soft, nontender, nondistended, + BS MS: no deformity or atrophy Skin: warm and dry  Neuro:  CNs II-XII intact, Strength and sensation are intact Psych: Normal affect   EKG:  EKG is ordered today.  It demonstrates:   NSR, HR 65, normal axis, nonspecific ST-T wave changes, frequent PVCs   Recent Labs: 10/23/2014: BUN 17; Creatinine, Ser 0.89; Hemoglobin 14.8; Magnesium 1.9; Platelets 157; Potassium 4.0; Sodium 138; TSH 1.395  Lipid Panel No results found for: CHOL, TRIG, HDL, CHOLHDL, VLDL, LDLCALC, LDLDIRECT    ASSESSMENT AND PLAN:  Palpitations:  He had noted PVCs in the emergency room recently. I suspect he was symptomatic from this.His symptoms are improved since reducing his caffeine. However, he had a large amount of PVCs on EKG in the emergency room. He continues to have a large amount of PVCs today. As noted, he is now fairly asymptomatic with this. Magnesium, potassium and TSH were normal the emergency room. I will obtain a 24-hour Holter to determine PVC burden. I will also obtain an echocardiogram to reassess LV function. I will adjust his beta blocker to Toprol-XL 75 mg twice a day. He already has a follow-up with Dr. Percival Spanish later this month. He should keep that appointment.  Aortic stenosis S/P AVR (aortic valve replacement):  Continue aspirin. Obtain follow-up echocardiogram as noted. Continue SBE prophylaxis.  Paroxysmal atrial fibrillation S/P ablation of atrial fibrillation:  Maintaining NSR. Recent symptoms were unlikely to be atrial fibrillation. I suspect he was symptomatic from PVCs. Obtain 24-hour Holter is noted.  Essential hypertension:  Blood  pressure elevated today. Adjust Toprol as noted above.  Coronary artery disease involving native coronary artery of native heart without angina pectoris:  No angina. Continue aspirin, beta blocker, ARB.     Medication Changes: Current medicines are reviewed at length with the patient today.  Concerns regarding medicines are as outlined above.  The following changes have been made:   Discontinued Medications   CEFDINIR (OMNICEF) 300 MG CAPSULE    TK 1 C PO Q 12 H FOR 10 DAYS   METH-HYO-M BL-NA PHOS-PH SAL (URIBEL PO)    Take 1 capsule by mouth daily.   METOPROLOL (LOPRESSOR) 25 MG TABLET    Take 2 tablets (50 mg total) by mouth once as needed (At bedtime/night as needed for palpitations).   METOPROLOL SUCCINATE (TOPROL-XL) 100 MG 24 HR TABLET    Take 1 tablet by mouth every morning.    Modified Medications   No medications on file   New Prescriptions   METOPROLOL SUCCINATE (TOPROL-XL) 50 MG 24 HR TABLET    Take 1 and 1/2 tabs twice daily = 75 mg twice daily; Take with or immediately following a meal.     Labs/ tests ordered today include:   Orders Placed This Encounter  Procedures  . Holter monitor - 24 hour  . EKG 12-Lead  . Echocardiogram     Disposition:   FU with Dr. Percival Spanish later this month as planned.   Signed, Versie Starks, MHS 11/08/2014 1:14 PM    [facility] [address], [place], [place]  [address] Phone: [phone]; Fax: [phone]

## 2014-11-08 ENCOUNTER — Encounter: Payer: Self-pay | Admitting: Physician Assistant

## 2014-11-08 ENCOUNTER — Ambulatory Visit (INDEPENDENT_AMBULATORY_CARE_PROVIDER_SITE_OTHER): Payer: Medicare Other | Admitting: Physician Assistant

## 2014-11-08 VITALS — BP 140/82 | HR 65 | Ht 67.0 in | Wt 217.0 lb

## 2014-11-08 DIAGNOSIS — Z9889 Other specified postprocedural states: Secondary | ICD-10-CM

## 2014-11-08 DIAGNOSIS — I2510 Atherosclerotic heart disease of native coronary artery without angina pectoris: Secondary | ICD-10-CM

## 2014-11-08 DIAGNOSIS — I350 Nonrheumatic aortic (valve) stenosis: Secondary | ICD-10-CM | POA: Diagnosis not present

## 2014-11-08 DIAGNOSIS — Z952 Presence of prosthetic heart valve: Secondary | ICD-10-CM

## 2014-11-08 DIAGNOSIS — I480 Paroxysmal atrial fibrillation: Secondary | ICD-10-CM | POA: Diagnosis not present

## 2014-11-08 DIAGNOSIS — Z954 Presence of other heart-valve replacement: Secondary | ICD-10-CM | POA: Diagnosis not present

## 2014-11-08 DIAGNOSIS — R002 Palpitations: Secondary | ICD-10-CM

## 2014-11-08 DIAGNOSIS — I10 Essential (primary) hypertension: Secondary | ICD-10-CM

## 2014-11-08 DIAGNOSIS — Z8679 Personal history of other diseases of the circulatory system: Secondary | ICD-10-CM | POA: Insufficient documentation

## 2014-11-08 MED ORDER — METOPROLOL SUCCINATE ER 50 MG PO TB24: 75.0000 mg | ORAL_TABLET | ORAL | Status: DC

## 2014-11-08 MED ORDER — METOPROLOL SUCCINATE ER 50 MG PO TB24: ORAL_TABLET | ORAL | Status: DC

## 2014-11-08 NOTE — Patient Instructions (Addendum)
Medication Instructions:  1. INCREASE TOPROL XL TO 75 MG TWICE DAILY  Labwork: NONE  Testing/Procedures: Your physician has requested that you have an echocardiogram. Echocardiography is a painless test that uses sound waves to create images of your heart. It provides your doctor with information about the size and shape of your heart and how well your heart's chambers and valves are working. This procedure takes approximately one hour. There are no restrictions for this procedure.  Your physician has recommended that you wear a 24 HOUR holter monitor. Holter monitors are medical devices that record the heart's electrical activity. Doctors most often use these monitors to diagnose arrhythmias. Arrhythmias are problems with the speed or rhythm of the heartbeat. The monitor is a small, portable device. You can wear one while you do your normal daily activities. This is usually used to diagnose what is causing palpitations/syncope (passing out).  Follow-Up: KEEP APPT WITH DR. HOCHREIN 11/29/14  Any Other Special Instructions Will Be Listed Below (If Applicable).  DECREASE CAFFEINE AS MUCH AS POSSIBLE

## 2014-11-12 ENCOUNTER — Telehealth: Payer: Self-pay

## 2014-11-12 NOTE — Telephone Encounter (Signed)
Metoprolol ER succ 50mg  approved by [facility]. Pharmacy notified.

## 2014-11-12 NOTE — Telephone Encounter (Signed)
Prior auth for Metoprolol ER 50mg  sent to [facility] via Cover My Meds.

## 2014-11-18 ENCOUNTER — Other Ambulatory Visit: Payer: Self-pay

## 2014-11-18 ENCOUNTER — Encounter: Payer: Self-pay | Admitting: Physician Assistant

## 2014-11-18 ENCOUNTER — Ambulatory Visit (INDEPENDENT_AMBULATORY_CARE_PROVIDER_SITE_OTHER): Payer: Medicare Other

## 2014-11-18 ENCOUNTER — Telehealth: Payer: Self-pay | Admitting: *Deleted

## 2014-11-18 ENCOUNTER — Ambulatory Visit (HOSPITAL_COMMUNITY): Payer: Medicare Other | Attending: Physician Assistant

## 2014-11-18 DIAGNOSIS — R002 Palpitations: Secondary | ICD-10-CM

## 2014-11-18 DIAGNOSIS — Z952 Presence of prosthetic heart valve: Secondary | ICD-10-CM

## 2014-11-18 DIAGNOSIS — I340 Nonrheumatic mitral (valve) insufficiency: Secondary | ICD-10-CM | POA: Diagnosis not present

## 2014-11-18 DIAGNOSIS — I350 Nonrheumatic aortic (valve) stenosis: Secondary | ICD-10-CM | POA: Diagnosis present

## 2014-11-18 DIAGNOSIS — I071 Rheumatic tricuspid insufficiency: Secondary | ICD-10-CM | POA: Insufficient documentation

## 2014-11-18 DIAGNOSIS — I480 Paroxysmal atrial fibrillation: Secondary | ICD-10-CM | POA: Diagnosis not present

## 2014-11-18 DIAGNOSIS — Z954 Presence of other heart-valve replacement: Secondary | ICD-10-CM | POA: Diagnosis not present

## 2014-11-18 DIAGNOSIS — I517 Cardiomegaly: Secondary | ICD-10-CM | POA: Insufficient documentation

## 2014-11-18 NOTE — Telephone Encounter (Signed)
Pt notified of echo results with verbal understanding by phone to results. Pt said thank you.

## 2014-11-29 ENCOUNTER — Ambulatory Visit (INDEPENDENT_AMBULATORY_CARE_PROVIDER_SITE_OTHER): Payer: Medicare Other | Admitting: Cardiology

## 2014-11-29 ENCOUNTER — Encounter: Payer: Self-pay | Admitting: Cardiology

## 2014-11-29 VITALS — BP 190/88 | HR 58 | Ht 67.0 in | Wt 215.0 lb

## 2014-11-29 DIAGNOSIS — I2510 Atherosclerotic heart disease of native coronary artery without angina pectoris: Secondary | ICD-10-CM | POA: Diagnosis not present

## 2014-11-29 DIAGNOSIS — G4730 Sleep apnea, unspecified: Secondary | ICD-10-CM | POA: Diagnosis not present

## 2014-11-29 MED ORDER — FUROSEMIDE 20 MG PO TABS: 10.0000 mg | ORAL_TABLET | Freq: Every day | ORAL | Status: DC

## 2014-11-29 MED ORDER — CHLORTHALIDONE 25 MG PO TABS: 12.5000 mg | ORAL_TABLET | Freq: Every day | ORAL | Status: DC

## 2014-11-29 NOTE — Progress Notes (Signed)
HPI The patient presents for evaluation of aortic valve replacement. He had bicuspid aortic valve and had this replaced in [place]. He's had a mildly reduced ejection fraction and minimal atherosclerotic coronary disease. He did have atrial fibrillation but he had pulmonary vein isolation, ligation of his atrial appendage and he has had no further atrial fibrillation. He was in the hospital in June and was found to have some frequent PVCs and palpitations. His dose of beta blocker was increased but this didn't seem to help. He's not noticing his main palpitations now. He did have a follow-up echocardiogram which demonstrated well-preserved ejection fraction and stable aortic valve replacement. He wore a Holter monitor and I reviewed these results yesterday. He does have PACs and some frequent PVCs with occasional bigeminy, couplets and triplets and rare runs no greater than 4 beats. He's not having any presyncope or syncope. He does get dyspnea with exertion occasionally but this is not routinely reproducible. He's not having any new PND or orthopnea. He's not having any chest pressure, neck or arm discomfort. Of note he reports he's never had any coronary disease when he had his cath prior to valve surgery.  Allergies  Allergen Reactions  . Nitroglycerin Swelling    Of lips and tongue    Current Outpatient Prescriptions  Medication Sig Dispense Refill  . amLODipine (NORVASC) 10 MG tablet Take 1 tablet by mouth every morning.     Marland Kitchen aspirin EC 81 MG tablet Take 81 mg by mouth daily.    . furosemide (LASIX) 40 MG tablet Take 1 tablet by mouth every morning.     . lansoprazole (PREVACID) 30 MG capsule Take 30 mg by mouth daily.    Marland Kitchen losartan (COZAAR) 100 MG tablet Take 1 tablet by mouth every morning.     . metoprolol succinate (TOPROL-XL) 50 MG 24 hr tablet Take 1 and 1/2 tabs twice daily = 75 mg twice daily; Take with or immediately following a meal. (Patient taking differently:  Take 50 mg by mouth 2 (two) times daily. ) 90 tablet 3  . potassium chloride SA (K-DUR,KLOR-CON) 20 MEQ tablet Take 1 tablet by mouth Daily.     No current facility-administered medications for this visit.    Past Medical History  Diagnosis Date  . Coronary artery disease     a. Cath 2003 nl cors;  b. Cath 2011 nonobs dzs;  c.  10/2011 Cath: LAD mod prox dzs, mild LCX & RCA dzs.  . Bicuspid aortic valve     a. 09/2011 Echo: EF 45-50%, Bicusp AoV with mild-mod AI and Sev AS. Mod-sev dil LA, mild-mod MR;  b. 10/2011 Bioprosthetic AVR - Carpentier-Edwards #25;  c. 10/2011 post-op echo EF 52%, AoV prosthesis w/ 6mmHg peak and 27mmHg mean gradietn, Gr2 DD.  Marland Kitchen DVT (deep venous thrombosis)     a. following back surgery in past.  . Coumadin resistance     a. pt reports labile INR in past.  . PAF (paroxysmal atrial fibrillation)     a. s/p DCCV in 2011;  b. s/p Pulm Vein isolation (AtriCure) & LAA clipping (AtriClip) @ time of AVR 10/2011;  c. CHADS2=1  . HTN (hypertension)   . Complication of anesthesia     NAUSEA  . H/O aortic valve replacement 2013  . Dysrhythmia     HX A FIB - ABLATION 2013 - RESOLVED  . Prostate cancer   . History of kidney stones   . Radiation 01/29/14 - 03/25/14  prostate bed 68.4 gray  . History of echocardiogram     Echo 7/16: Moderate LVH, EF 55-60%, normal wall motion, normally functioning bioprosthetic AVR (mean gradient 18 mmHg), trivial MR, moderate LAE, mild RAE, mild TR, PASP 29 mmHg    Past Surgical History  Procedure Laterality Date  . Aortic valve replacement  2013  . Back surgery      Multiple procedures reported in the records  . Robot assisted laparoscopic radical prostatectomy N/A 08/24/2013    Procedure: ROBOTIC ASSISTED LAPAROSCOPIC RADICAL PROSTATECTOMY WITH INDOCYANINE GREEN DYE;  Surgeon: Alexis Frock, MD;  Location: [facility];  Service: Urology;  Laterality: N/A;  . Lymphadenectomy Bilateral 08/24/2013    Procedure: LYMPHADENECTOMY ;  Surgeon:  Alexis Frock, MD;  Location: [facility];  Service: Urology;  Laterality: Bilateral;   ROS:  Positive for ED.  Otherwise as stated in the HPI and negative for all other systems.  PHYSICAL EXAM BP 190/88 mmHg  Pulse 58  Ht 5\' 7"  (1.702 m)  Wt 215 lb (97.523 kg)  BMI 33.67 kg/m2 GENERAL:  Well appearing HEENT:  Pupils equal round and reactive, fundi not visualized, oral mucosa unremarkable NECK:  No jugular venous distention, waveform within normal limits, carotid upstroke brisk and symmetric, no bruits, no thyromegaly LYMPHATICS:  No cervical, inguinal adenopathy LUNGS:  Clear to auscultation bilaterally BACK:  No CVA tenderness CHEST:  Well healed sternotomy scar. HEART:  PMI not displaced or sustained,S1 and S2 within normal limits, no S3, no S4, no clicks, no rubs,  soft apical early peaking systolic murmur, no diastolic murmurs ABD:  Flat, positive bowel sounds normal in frequency in pitch, no bruits, no rebound, no guarding, no midline pulsatile mass, no hepatomegaly, no splenomegaly EXT:  2 plus pulses throughout,  mild bilateral lower extremity edema, no cyanosis no clubbing   ASSESSMENT AND PLAN  Bicuspid AoV/Severe Ao Stenosis:  He is doing well symptomatically with this.  No further imaging is indicated.  No change in therapy.  I looked at the results of the echo and he has a stable prosthesis.  HTN: BP elevated today.   I reduced his Lasix as he is not having lots of symptoms and he does have leg cramping. We will go down to 10 mg daily. I'm going to start chlorthalidone.  He will keep a blood pressure diary and we will titrate as needed.  Of note he says that his blood pressure is not as elevated in his home as it is in here. However, his wife reports that it is very often in the systolic 102H and diastolic greater than 85I.  PVCs I reviewed with him his Holter diary. He does have frequent ventricular ectopy but no symptoms related to this. He's had no ischemic heart disease in  a well preserved ejection fraction. For now he'll continue with the meds as listed although I might go up on his beta blocker in the future.

## 2014-11-29 NOTE — Patient Instructions (Signed)
Your physician wants you to follow-up in: 6 Months. You will receive a reminder letter in the mail two months in advance. If you don't receive a letter, please call our office to schedule the follow-up appointment.  Your physician has recommended that you have a sleep study. This test records several body functions during sleep, including: brain activity, eye movement, oxygen and carbon dioxide blood levels, heart rate and rhythm, breathing rate and rhythm, the flow of air through your mouth and nose, snoring, body muscle movements, and chest and belly movement.  Your physician has recommended you make the following change in your medication: Decrease Furosemide(Lasix) 10 mg daily and START Chlorthalidone 12.5 mg daily.

## 2015-02-19 ENCOUNTER — Ambulatory Visit (HOSPITAL_BASED_OUTPATIENT_CLINIC_OR_DEPARTMENT_OTHER): Payer: Medicare Other | Attending: Cardiology | Admitting: Radiology

## 2015-02-19 VITALS — Ht 67.0 in | Wt 207.0 lb

## 2015-02-19 DIAGNOSIS — I493 Ventricular premature depolarization: Secondary | ICD-10-CM | POA: Insufficient documentation

## 2015-02-19 DIAGNOSIS — E669 Obesity, unspecified: Secondary | ICD-10-CM | POA: Insufficient documentation

## 2015-02-19 DIAGNOSIS — I10 Essential (primary) hypertension: Secondary | ICD-10-CM | POA: Diagnosis not present

## 2015-02-19 DIAGNOSIS — R5383 Other fatigue: Secondary | ICD-10-CM | POA: Diagnosis not present

## 2015-02-19 DIAGNOSIS — G4736 Sleep related hypoventilation in conditions classified elsewhere: Secondary | ICD-10-CM | POA: Diagnosis not present

## 2015-02-19 DIAGNOSIS — G4733 Obstructive sleep apnea (adult) (pediatric): Secondary | ICD-10-CM | POA: Insufficient documentation

## 2015-02-19 DIAGNOSIS — R0683 Snoring: Secondary | ICD-10-CM | POA: Diagnosis not present

## 2015-02-19 DIAGNOSIS — G4730 Sleep apnea, unspecified: Secondary | ICD-10-CM | POA: Diagnosis present

## 2015-02-19 DIAGNOSIS — Z6832 Body mass index (BMI) 32.0-32.9, adult: Secondary | ICD-10-CM | POA: Insufficient documentation

## 2015-02-26 ENCOUNTER — Telehealth: Payer: Self-pay | Admitting: Cardiology

## 2015-02-26 NOTE — Telephone Encounter (Signed)
Returned call to patient no answer.LMTC. 

## 2015-02-26 NOTE — Telephone Encounter (Signed)
Per the Answering Service: Received call,need to know what call was concerning.

## 2015-03-02 NOTE — Sleep Study (Signed)
Patient Name: Antonio Romero, Meng Date: 02/19/2015 Gender: Male D.O.B: 06-26-1945 Age (years): 68 Referring Provider: Minus Breeding Height (inches): 24 Interpreting Physician: Shelva Majestic MD, ABSM Weight (lbs): 207 RPSGT: Laren Everts BMI: 32 MRN: [id number] Neck Size: 17.50  CLINICAL INFORMATION Sleep Study Type: NPSG  Indication for sleep study: Daytime Fatigue, Fatigue, Hypertension, Obesity, Restless Sleep with Limb Movments, Snoring, Witnesses Apnea / Gasping During Sleep  Epworth Sleepiness Score: 16  SLEEP STUDY TECHNIQUE As per the AASM Manual for the Scoring of Sleep and Associated Events v2.3 (April 2016) with a hypopnea requiring 4% desaturations. The channels recorded and monitored were frontal, central and occipital EEG, electrooculogram (EOG), submentalis EMG (chin), nasal and oral airflow, thoracic and abdominal wall motion, anterior tibialis EMG, snore microphone, electrocardiogram, and pulse oximetry.  MEDICATIONS  amLODipine (NORVASC) 10 MG tablet 1 tablet, Every morning - 10a     aspirin EC 81 MG tablet 81 mg, Daily     chlorthalidone (HYGROTON) 25 MG tablet 12.5 mg, Daily     furosemide (LASIX) 20 MG tablet 10 mg, Daily     lansoprazole (PREVACID) 30 MG capsule 30 mg, Daily     losartan (COZAAR) 100 MG tablet 1 tablet, Every morning - 10a     metoprolol succinate (TOPROL-XL) 100 MG 24 hr tablet 1 tablet, Daily     Note: Received from: External Pharmacy Received Sig: TAKE 1 T PO D (Written 11/29/2014 1557)   potassium chloride SA (K-DUR,KLOR-CON) 20 MEQ tablet   Medications self-administered by patient during sleep study : No sleep medicine administered.  SLEEP ARCHITECTURE The study was initiated at 10:48:44 PM and ended at 5:24:22 AM. Sleep onset time was 1.4 minutes and the sleep efficiency was 83.3%. The total sleep time was 329.5 minutes. Wake after sleep onset (WASO): 64.8 minutes Stage REM latency was 125.0 minutes. The patient spent  11.23% of the night in stage N1 sleep, 76.18% in stage N2 sleep, 0.00% in stage N3 and 12.59% in REM. Alpha intrusion was absent. Supine sleep was 0.00%. RESPIRATORY PARAMETERS The overall apnea/hypopnea index (AHI) was 10.2 per hour. There were 11 total apneas, including 8 obstructive, 3 central and 0 mixed apneas. There were 45 hypopneas and 54 RERAs. The AHI during Stage REM sleep was 18.8 per hour. AHI while supine was N/A per hour. The mean oxygen saturation was 92.55%. The minimum SpO2 during sleep was 78.00%. Moderate snoring was noted during this study.  CARDIAC DATA The 2 lead EKG demonstrated sinus rhythm. The mean heart rate was 60.08 beats per minute. Other EKG findings include: PVCs.  LEG MOVEMENT DATA The total PLMS were 22 with a resulting PLMS index of 4.01. Associated arousal with leg movement index was 0.9 .  IMPRESSIONS - Mild obstructive sleep apnea/hyponea syndrome (AHI 10.2/h); however, sleep apnea was moderate during REM sleep with an AHI of 18.8.  The severity of the patient's sleep apnea may be underestimated due to the absence of supine sleep on the present study. - No significant central sleep apnea occurred during this study (CAI = 0.5/h). - Reduced sleep efficiency. - Abnormal sleep architecture with absence of slow-wave sleep and prolonged latency to in REM sleep. - Significant oxygen desaturation to a nadir of 78% - The patient snored with Moderate snoring volume. - The patient was in sinus rhythm, however, there were occasional to frequent PVCs noted throughout the study. - Clinically significant periodic limb movements did not occur during sleep. No significant associated arousals.  DIAGNOSIS - Obstructive Sleep  Apnea (327.23 [G47.33 ICD-10]) - Nocturnal Hypoxemia (327.26 [G47.36 ICD-10])  RECOMMENDATIONS - Therapeutic CPAP titration to determine optimal pressure required to alleviate sleep disordered breathing. - Effort should be made to optimize  nasal and oropharyngeal patency - Avoid alcohol, sedatives and other CNS depressants that may worsen sleep apnea and disrupt normal sleep architecture. - Sleep hy- Therapeutic CPAP titration to determine optimal pressure required to alleviate sleep disordered breathing. - Egiene should be reviewed to assess factors that may improve sleep quality. - Weight management and regular exercise should be initiated or continued if appropriate.   [name], MD, [place], American Board of Sleep Medicine  ELECTRONICALLY SIGNED ON:  03/02/2015, 5:17 PM [place] PH: [phone]   FX: [phone] [place]

## 2015-03-03 NOTE — Telephone Encounter (Signed)
Returned call to patient. The call he had received was r/t scheduling an OV which has already been completed. No further action necessary.

## 2015-03-07 ENCOUNTER — Telehealth: Payer: Self-pay | Admitting: *Deleted

## 2015-03-07 DIAGNOSIS — G4733 Obstructive sleep apnea (adult) (pediatric): Secondary | ICD-10-CM

## 2015-03-07 NOTE — Telephone Encounter (Signed)
-----   Message from [name], MD sent at 03/02/2015  5:24 PM EDT ----- [name]; please set up for CPAP titration

## 2015-03-07 NOTE — Progress Notes (Signed)
Left message to return call  03/07/15

## 2015-03-11 NOTE — Telephone Encounter (Signed)
Patient states he was to call you back today.

## 2015-03-11 NOTE — Telephone Encounter (Signed)
Patient notified of sleep study results and recommendations. Patient voiced verbal understanding. CPAP titration ordered.

## 2015-03-11 NOTE — Progress Notes (Signed)
Patient notified of results and recommendations. CPAP titration ordered.

## 2015-03-11 NOTE — Telephone Encounter (Signed)
-----   Message from [name], MD sent at 03/02/2015  5:24 PM EDT ----- [name]; please set up for CPAP titration

## 2015-05-07 ENCOUNTER — Ambulatory Visit (HOSPITAL_BASED_OUTPATIENT_CLINIC_OR_DEPARTMENT_OTHER): Payer: Medicare Other | Attending: Cardiovascular Disease | Admitting: Radiology

## 2015-05-07 DIAGNOSIS — I4891 Unspecified atrial fibrillation: Secondary | ICD-10-CM | POA: Diagnosis not present

## 2015-05-07 DIAGNOSIS — I493 Ventricular premature depolarization: Secondary | ICD-10-CM | POA: Insufficient documentation

## 2015-05-07 DIAGNOSIS — Z7982 Long term (current) use of aspirin: Secondary | ICD-10-CM | POA: Diagnosis not present

## 2015-05-07 DIAGNOSIS — G4736 Sleep related hypoventilation in conditions classified elsewhere: Secondary | ICD-10-CM | POA: Insufficient documentation

## 2015-05-07 DIAGNOSIS — Z79899 Other long term (current) drug therapy: Secondary | ICD-10-CM | POA: Diagnosis not present

## 2015-05-07 DIAGNOSIS — G4733 Obstructive sleep apnea (adult) (pediatric): Secondary | ICD-10-CM | POA: Diagnosis not present

## 2015-05-07 DIAGNOSIS — G4731 Primary central sleep apnea: Secondary | ICD-10-CM | POA: Insufficient documentation

## 2015-05-18 NOTE — Sleep Study (Signed)
Patient Name: Antonio Romero, Antonio Romero Date: 05/07/2015 Gender: Male D.O.B: [birth year] Age (years): 70 Referring Provider: Minus Breeding Height (inches): 40 Interpreting Physician: Shelva Majestic MD, ABSM Weight (lbs): 207 RPSGT: Carolin Coy BMI: 32 MRN: [id number] Neck Size: 17.50  CLINICAL INFORMATION The patient is referred for a CPAP/BiPAP titration to treat sleep apnea. On the diagnostic study there was absence of supine sleep. The AHI 10.2/h overall; 18.8/hr during REM sleep and oxygen desaturation of 78%.   Date of NPSG, Split Night or HST: 03/02/2015  SLEEP STUDY TECHNIQUE As per the AASM Manual for the Scoring of Sleep and Associated Events v2.3 (April 2016) with a hypopnea requiring 4% desaturations. The channels recorded and monitored were frontal, central and occipital EEG, electrooculogram (EOG), submentalis EMG (chin), nasal and oral airflow, thoracic and abdominal wall motion, anterior tibialis EMG, snore microphone, electrocardiogram, and pulse oximetry. Bilevel positive airway pressure (BPAP) was initiated at the beginning of the study and titrated to treat sleep-disordered breathing.  MEDICATIONS  amLODipine (NORVASC) 10 MG tablet 1 tablet, Every morning - 10a     aspirin EC 81 MG tablet 81 mg, Daily     chlorthalidone (HYGROTON) 25 MG tablet 12.5 mg, Daily     furosemide (LASIX) 20 MG tablet 10 mg, Daily     lansoprazole (PREVACID) 30 MG capsule 30 mg, Daily     losartan (COZAAR) 100 MG tablet 1 tablet, Every morning - 10a     metoprolol succinate (TOPROL-XL) 100 MG 24 hr tablet 1 tablet, Daily     Note: Received from: External Pharmacy Received Sig: TAKE 1 T PO D (Written 11/29/2014 1557)   potassium chloride SA (K-DUR,KLOR-CON) 20 MEQ tablet 1 tablet, Daily   Medications administered by patient during sleep study : No sleep medicine administered.  RESPIRATORY PARAMETERS Optimal IPAP Pressure (cm): 14 AHI at Optimal Pressure (/hr) N/A Optimal EPAP Pressure  (cm): 10   Overall Minimal O2 (%): 0.00 Minimal O2 at Optimal Pressure (%): 0.00  SLEEP ARCHITECTURE Start Time: 10:56:32 PM Stop Time: 5:02:59 AM Total Time (min): 366.4 Total Sleep Time (min): 315.5 Sleep Latency (min): 5.8 Sleep Efficiency (%): 86.1 REM Latency (min): 122.5 WASO (min): 45.2 Stage N1 (%): 5.55 Stage N2 (%): 61.65 Stage N3 (%): 13.47 Stage R (%): 19.33 Supine (%): 0.00 Arousal Index (/hr): 10.1      CARDIAC DATA The 2 lead EKG demonstrated atrial fibrillation. The mean heart rate was 60.54 beats per minute. Other EKG findings include: PVCs.  LEG MOVEMENT DATA The total Periodic Limb Movements of Sleep (PLMS) were 26. The PLMS index was 4.94. A PLMS index of <15 is considered normal in adults.  IMPRESSIONS - The patient initiated CPAP at 5 cm and was titrated to 15 cm water pressure. Due to increasing central apneic events  BiPAP therapy was initiated at 14/10; however, optimal titration pressure was not achieved.  -  Central Sleep Apnea was noted during this titration (CAI = 14.5/h). -  Mild oxygen desaturation. - No snoring was audible during this study. - 2-lead EKG demonstrated: Atrial fibrillation and PVCs - Clinically significant periodic limb movements were not noted during this study. Arousals associated with PLMs were rare.  DIAGNOSIS - Obstructive Sleep Apnea (327.23 [G47.33 ICD-10]) - Nocturnal Hypoxemia (327.26 [G47.36 ICD-10]) - Central sleep apnea  327.21  RECOMMENDATIONS - Recommend an initial trial with Bi-PAP Auto with an EPAP min of 7, delta of 4, IPAP min of 11 and IPAP max of 25 with heated humidification.  A Small  size Fisher&Paykel Full Face Mask Simplus mask was used during the study.  If continued central events develop ASV titration may be necessary. - Avoid alcohol, sedatives and other CNS depressants that may worsen sleep apnea and disrupt normal sleep architecture. - Sleep hygiene should be reviewed to assess factors that may improve sleep  quality. - Weight management and regular exercise should be initiated or continued. - Recommend a download be obtained in 30 days and sleep clinic evaluation.   [name], MD, [place], American Board of Sleep Medicine  ELECTRONICALLY SIGNED ON:  05/18/2015, 1:17 PM [place] PH: [phone]   FX: [phone] [place]

## 2015-05-21 ENCOUNTER — Telehealth: Payer: Self-pay | Admitting: *Deleted

## 2015-05-21 NOTE — Telephone Encounter (Signed)
-----   Message from [name], MD sent at 05/18/2015  1:46 PM EST ----- [name], please set up with DME for BiPAP Auto  And sleep clinic f/u

## 2015-06-01 NOTE — Progress Notes (Signed)
HPI The patient presents for evaluation of aortic valve replacement. He had bicuspid aortic valve and had this replaced in [place]. He's had a mildly reduced ejection fraction and minimal atherosclerotic coronary disease. He did have atrial fibrillation but he had pulmonary vein isolation, ligation of his atrial appendage and he has had no further atrial fibrillation. He did have frequent PVCs last year and presented to the emergency room. He was found to be drinking too much caffeine. He did have follow-up. He was seen in our office. Echocardiography was done last summer and his valve replacement was fine. His EF was well-preserved.  He has no new complaints. He's not feeling any palpitations. He denies any shortness of breath, PND or orthopnea. He's had no weight gain or edema. He rides a bicycle rarely. He does some work on cars. He doesn't exercise routinely.  Allergies  Allergen Reactions  . Nitroglycerin Swelling    Of lips and tongue    Current Outpatient Prescriptions  Medication Sig Dispense Refill  . amLODipine (NORVASC) 10 MG tablet Take 1 tablet by mouth every morning.     Marland Kitchen aspirin EC 81 MG tablet Take 81 mg by mouth daily.    . chlorthalidone (HYGROTON) 25 MG tablet Take 0.5 tablets (12.5 mg total) by mouth daily. 30 tablet 6  . furosemide (LASIX) 20 MG tablet Take 0.5 tablets (10 mg total) by mouth daily. 30 tablet 6  . lansoprazole (PREVACID) 30 MG capsule Take 30 mg by mouth daily.    Marland Kitchen losartan (COZAAR) 100 MG tablet Take 1 tablet by mouth every morning.     . metoprolol succinate (TOPROL-XL) 100 MG 24 hr tablet Take 1 tablet by mouth daily.  7  . potassium chloride SA (K-DUR,KLOR-CON) 20 MEQ tablet Take 1 tablet by mouth Daily.     No current facility-administered medications for this visit.    Past Medical History  Diagnosis Date  . Coronary artery disease     a. Cath 2003 nl cors;  b. Cath 2011 nonobs dzs;  c.  10/2011 Cath: LAD mod prox dzs, mild  LCX & RCA dzs.  . Bicuspid aortic valve     a. 09/2011 Echo: EF 45-50%, Bicusp AoV with mild-mod AI and Sev AS. Mod-sev dil LA, mild-mod MR;  b. 10/2011 Bioprosthetic AVR - Carpentier-Edwards #25;  c. 10/2011 post-op echo EF 52%, AoV prosthesis w/ 61mmHg peak and 80mmHg mean gradietn, Gr2 DD.  Marland Kitchen DVT (deep venous thrombosis)     a. following back surgery in past.  . Coumadin resistance     a. pt reports labile INR in past.  . PAF (paroxysmal atrial fibrillation)     a. s/p DCCV in 2011;  b. s/p Pulm Vein isolation (AtriCure) & LAA clipping (AtriClip) @ time of AVR 10/2011;  c. CHADS2=1  . HTN (hypertension)   . Complication of anesthesia     NAUSEA  . H/O aortic valve replacement 2013  . Dysrhythmia     HX A FIB - ABLATION 2013 - RESOLVED  . Prostate cancer   . History of kidney stones   . Radiation 01/29/14 - 03/25/14    prostate bed 68.4 gray  . History of echocardiogram     Echo 7/16: Moderate LVH, EF 55-60%, normal wall motion, normally functioning bioprosthetic AVR (mean gradient 18 mmHg), trivial MR, moderate LAE, mild RAE, mild TR, PASP 29 mmHg    Past Surgical History  Procedure Laterality Date  . Aortic valve replacement  2013  .  Back surgery      Multiple procedures reported in the records  . Robot assisted laparoscopic radical prostatectomy N/A 08/24/2013    Procedure: ROBOTIC ASSISTED LAPAROSCOPIC RADICAL PROSTATECTOMY WITH INDOCYANINE GREEN DYE;  Surgeon: Alexis Frock, MD;  Location: [facility];  Service: Urology;  Laterality: N/A;  . Lymphadenectomy Bilateral 08/24/2013    Procedure: LYMPHADENECTOMY ;  Surgeon: Alexis Frock, MD;  Location: [facility];  Service: Urology;  Laterality: Bilateral;   ROS:  Positive for ED.  Otherwise as stated in the HPI and negative for all other systems.  PHYSICAL EXAM There were no vitals taken for this visit. GENERAL:  Well appearing HEENT:  Pupils equal round and reactive, fundi not visualized, oral mucosa unremarkable NECK:  No jugular  venous distention, waveform within normal limits, carotid upstroke brisk and symmetric, possible left bruits, no thyromegaly LYMPHATICS:  No cervical, inguinal adenopathy LUNGS:  Clear to auscultation bilaterally BACK:  No CVA tenderness CHEST:  Well healed sternotomy scar. HEART:  PMI not displaced or sustained,S1 and S2 within normal limits, no S3, no S4, no clicks, no rubs,  soft apical early peaking systolic murmur, no diastolic murmurs ABD:  Flat, positive bowel sounds normal in frequency in pitch, no bruits, no rebound, no guarding, no midline pulsatile mass, no hepatomegaly, no splenomegaly EXT:  2 plus pulses throughout,  mild bilateral lower extremity edema, no cyanosis no clubbing   ASSESSMENT AND PLAN  Bicuspid AoV/AVR:  He is doing well symptomatically with this.  His last echo was 2016.  No further imaging is indicated.    HTN: The blood pressure is at target. No change in medications is indicated. We will continue with therapeutic lifestyle changes (TLC).  I reviewed his blood pressure diary.  PVCs These have improved since his reduced his caffeine.  SLEEP APNEA The patient has symptoms of daytime somnolence and snoring consistent with sleep apnea.  Testing was positive.  He will be fitted for CPAP.  BRUIT I will order a carotid Doppler

## 2015-06-02 ENCOUNTER — Ambulatory Visit (INDEPENDENT_AMBULATORY_CARE_PROVIDER_SITE_OTHER): Payer: Medicare Other | Admitting: Cardiology

## 2015-06-02 ENCOUNTER — Encounter: Payer: Self-pay | Admitting: Cardiology

## 2015-06-02 VITALS — BP 144/80 | HR 56 | Ht 67.0 in | Wt 213.0 lb

## 2015-06-02 DIAGNOSIS — R0989 Other specified symptoms and signs involving the circulatory and respiratory systems: Secondary | ICD-10-CM

## 2015-06-02 NOTE — Patient Instructions (Signed)
Your physician has requested that you have a carotid duplex. This test is an ultrasound of the carotid arteries in your neck. It looks at blood flow through these arteries that supply the brain with blood. Allow one hour for this exam. There are no restrictions or special instructions.  Dr Percival Spanish recommends that you schedule a follow-up appointment in 1 year. You will receive a reminder letter in the mail two months in advance. If you don't receive a letter, please call our office to schedule the follow-up appointment.  If you need a refill on your cardiac medications before your next appointment, please call your pharmacy.

## 2015-06-09 ENCOUNTER — Ambulatory Visit (HOSPITAL_COMMUNITY)
Admission: RE | Admit: 2015-06-09 | Discharge: 2015-06-09 | Disposition: A | Discharge status: Home / Self Care | Payer: Medicare Other | Source: Ambulatory Visit | Attending: Cardiology | Admitting: Cardiology

## 2015-06-09 DIAGNOSIS — I10 Essential (primary) hypertension: Secondary | ICD-10-CM | POA: Insufficient documentation

## 2015-06-09 DIAGNOSIS — R0989 Other specified symptoms and signs involving the circulatory and respiratory systems: Secondary | ICD-10-CM | POA: Diagnosis not present

## 2015-06-09 DIAGNOSIS — I6523 Occlusion and stenosis of bilateral carotid arteries: Secondary | ICD-10-CM | POA: Diagnosis not present

## 2015-06-20 ENCOUNTER — Telehealth: Payer: Self-pay | Admitting: Cardiology

## 2015-06-20 NOTE — Telephone Encounter (Signed)
Message from [name], MD sent at 05/18/2015 1:46 PM EST ----- [name], please set up with DME for BiPAP Auto And sleep clinic f/u   Patient's wife said she hasn't had follow up about the this She said that medicare was not going to cover the cost of the machine because it was too long between test Forwarding to [place] so she can discuss with patient.

## 2015-06-20 NOTE — Telephone Encounter (Signed)
Per pt call returning office call.   Pt would like he [place] test results please call his home.

## 2015-06-30 NOTE — Telephone Encounter (Signed)
Pt still have not received his C-Pap machine,its been a long time.

## 2015-06-30 NOTE — Telephone Encounter (Signed)
Message routed to [place] to advise   Appears she has been working on this.

## 2015-07-01 ENCOUNTER — Telehealth: Payer: Self-pay | Admitting: Cardiology

## 2015-07-01 ENCOUNTER — Other Ambulatory Visit: Payer: Self-pay | Admitting: *Deleted

## 2015-07-01 DIAGNOSIS — G4733 Obstructive sleep apnea (adult) (pediatric): Secondary | ICD-10-CM

## 2015-07-01 NOTE — Telephone Encounter (Signed)
Returning your call. °

## 2015-07-01 NOTE — Progress Notes (Signed)
Waiting for Dr Percival Spanish to addend his July office note.

## 2015-07-01 NOTE — Telephone Encounter (Signed)
Returned a call and spoke with patient's wife informed her that due to the medicare rules he will have to go back to have another sleep study. The 6 month set up time has expired. All of her questions and concerns were addressed. She voiced her understanding and will relay this to the patient. New sleep study ordered.

## 2015-07-01 NOTE — Telephone Encounter (Signed)
-----   Message from Antonio Sine, MD sent at 05/18/2015  1:46 PM EST ----- [name], please set up with DME for BiPAP Auto  And sleep clinic f/u

## 2015-07-02 ENCOUNTER — Telehealth: Payer: Self-pay | Admitting: Cardiology

## 2015-07-02 NOTE — Telephone Encounter (Signed)
New message      Talk to the nurse about getting a cpap machine.  They need an order from the doctor

## 2015-07-03 NOTE — Telephone Encounter (Signed)
Pt calling again.

## 2015-07-03 NOTE — Telephone Encounter (Signed)
Follow. Up     Talk to the doctor about his sleep study.  He said he called yesterday but no return call

## 2015-07-03 NOTE — Telephone Encounter (Signed)
Patient states he called Medicare and was told there was no time limit on the sleep study and all that MD needs to do is write an order for CPAP and they will cover it no matter what. He states Medicare will not pay for another sleep study.   He was very upset stating that he was misinformed about Medicare guidelines - apologized and told him we can only communicate what we know to be true as far as guidelines go.   [place] in [place], [place] Patient states he received this info from [facility] and was told to send an order and CPAP and it would be covered for a year.   Message routed to MD & [name], [place] like patient wants an order for CPAP and will just see if it is covered.Marland Kitchen

## 2015-07-03 NOTE — Telephone Encounter (Signed)
LMTCB    Lauralee Evener, CMA at 07/01/2015 1:04 PM     Status: Signed       Expand All Collapse All   Returned a call and spoke with patient's wife informed her that due to the medicare rules he will have to go back to have another sleep study. The 6 month set up time has expired. All of her questions and concerns were addressed. She voiced her understanding and will relay this to the patient. New sleep study ordered.

## 2015-07-03 NOTE — Telephone Encounter (Signed)
Please let me know what I need to do

## 2015-07-03 NOTE — Telephone Encounter (Signed)
Returning your call. °

## 2015-07-04 NOTE — Progress Notes (Signed)
Dr Percival Spanish never done addendum to July note. He put information that was needed into the January note. Sent referral to DME company in [place] per patient request.

## 2015-07-04 NOTE — Telephone Encounter (Signed)
Message routed to [facility] to assist in ordering a CPAP to patient's specified DME location per his request.

## 2015-07-04 NOTE — Telephone Encounter (Signed)
BIPAP oreder faxed to [facility] in  [place] per patient request.

## 2015-07-11 ENCOUNTER — Telehealth: Payer: Self-pay | Admitting: Cardiology

## 2015-07-11 NOTE — Telephone Encounter (Signed)
Returning your call. °

## 2015-07-14 NOTE — Telephone Encounter (Signed)
Pt said he still have not heard from you.

## 2015-07-18 ENCOUNTER — Telehealth: Payer: Self-pay | Admitting: *Deleted

## 2015-07-18 NOTE — Telephone Encounter (Signed)
Patient called and spoke with [facility].

## 2015-07-18 NOTE — Telephone Encounter (Signed)
Spoke with Harmon Pier the RT @ [place] to confirm they did receive the patient's BIPAP referral that was sent in on February 24th. Sh confirmed that it was received however the FTF notes are not correct. I told her that I had previously informed the patient of this and he said that he was told by your company that he should be okay.  Harmon Pier now says that after reviewing his records that he will need to have another sleep study, which is what I had already told the patient. Ii asked her if anyone has contacted the patient to inform him of this and she replied "No" but that she will contact him.

## 2015-07-29 ENCOUNTER — Telehealth: Payer: Self-pay | Admitting: Cardiology

## 2015-07-29 NOTE — Telephone Encounter (Signed)
New message    home health calling checking on the status of form that was Fax request sent over 3.13.2017 for bipap machine.

## 2015-07-29 NOTE — Telephone Encounter (Signed)
Signed form was faxed back to [facility] for pt Bi-pap

## 2015-07-30 ENCOUNTER — Telehealth: Payer: Self-pay | Admitting: Cardiovascular Disease

## 2015-07-30 NOTE — Telephone Encounter (Signed)
Antonio Romero is calling to clarify the Bipap or Bi-pap auto .Marland KitchenShe is calling because its not clearing documentation and needs to make sure which one is needed . Please call   Thanks

## 2015-08-01 NOTE — Telephone Encounter (Signed)
Returned a call to Jessica Priest with [facility] care. Informed her per Dr Evette Georges order on sleep study that he should be on a BIPAP. Download after 30 days, if still with central apneas ASV titration may be needed.she verbally voiced understanding. Call ended.

## 2015-09-22 NOTE — Progress Notes (Signed)
HPI The patient presents for evaluation of aortic valve replacement. He had bicuspid aortic valve and had this replaced in [place]. He's had a mildly reduced ejection fraction and minimal atherosclerotic coronary disease. He did have atrial fibrillation but he had pulmonary vein isolation, ligation of his atrial appendage and he has had no further atrial fibrillation. He did have frequent PVCs.  However, he cut back on his caffeine and these are not symptomatic.  He did have sleep apnea and is having treatment of this.  He sleeps better with his mask but still has some waking episodes.  He does have some questions today about his device.  He is active in his yard.  He denies any shortness of breath, PND or orthopnea. He's had no weight gain or edema. He rides a bicycle rarely. He denies any chest pain, neck pain or arm pain.   Allergies  Allergen Reactions  . Nitroglycerin Swelling    Of lips and tongue    Current Outpatient Prescriptions  Medication Sig Dispense Refill  . amLODipine (NORVASC) 10 MG tablet Take 1 tablet by mouth every morning.     Marland Kitchen aspirin EC 81 MG tablet Take 81 mg by mouth daily.    . chlorthalidone (HYGROTON) 25 MG tablet Take 0.5 tablets (12.5 mg total) by mouth daily. 30 tablet 6  . furosemide (LASIX) 20 MG tablet Take 0.5 tablets (10 mg total) by mouth daily. 30 tablet 6  . lansoprazole (PREVACID) 30 MG capsule Take 30 mg by mouth daily.    Marland Kitchen losartan (COZAAR) 100 MG tablet Take 1 tablet by mouth every morning.     . metoprolol succinate (TOPROL-XL) 100 MG 24 hr tablet Take 1 tablet by mouth daily.  7  . potassium chloride SA (K-DUR,KLOR-CON) 20 MEQ tablet Take 1 tablet by mouth Daily.     No current facility-administered medications for this visit.    Past Medical History  Diagnosis Date  . Coronary artery disease     a. Cath 2003 nl cors;  b. Cath 2011 nonobs dzs;  c.  10/2011 Cath: LAD mod prox dzs, mild LCX & RCA dzs.  . Bicuspid aortic  valve     a. 09/2011 Echo: EF 45-50%, Bicusp AoV with mild-mod AI and Sev AS. Mod-sev dil LA, mild-mod MR;  b. 10/2011 Bioprosthetic AVR - Carpentier-Edwards #25;  c. 10/2011 post-op echo EF 52%, AoV prosthesis w/ 20mmHg peak and 8mmHg mean gradietn, Gr2 DD.  Marland Kitchen DVT (deep venous thrombosis) ([place])     a. following back surgery in past.  . Coumadin resistance ([place])     a. pt reports labile INR in past.  . PAF (paroxysmal atrial fibrillation) ([place])     a. s/p DCCV in 2011;  b. s/p Pulm Vein isolation (AtriCure) & LAA clipping (AtriClip) @ time of AVR 10/2011;  c. CHADS2=1  . HTN (hypertension)   . Complication of anesthesia     NAUSEA  . H/O aortic valve replacement 2013  . Dysrhythmia     HX A FIB - ABLATION 2013 - RESOLVED  . Prostate cancer ([place])   . History of kidney stones   . Radiation 01/29/14 - 03/25/14    prostate bed 68.4 gray  . History of echocardiogram     Echo 7/16: Moderate LVH, EF 55-60%, normal wall motion, normally functioning bioprosthetic AVR (mean gradient 18 mmHg), trivial MR, moderate LAE, mild RAE, mild TR, PASP 29 mmHg    Past Surgical History  Procedure Laterality  Date  . Aortic valve replacement  2013  . Back surgery      Multiple procedures reported in the records  . Robot assisted laparoscopic radical prostatectomy N/A 08/24/2013    Procedure: ROBOTIC ASSISTED LAPAROSCOPIC RADICAL PROSTATECTOMY WITH INDOCYANINE GREEN DYE;  Surgeon: Alexis Frock, MD;  Location: [facility];  Service: Urology;  Laterality: N/A;  . Lymphadenectomy Bilateral 08/24/2013    Procedure: LYMPHADENECTOMY ;  Surgeon: Alexis Frock, MD;  Location: [facility];  Service: Urology;  Laterality: Bilateral;   ROS:  Positive for ED.  Otherwise as stated in the HPI and negative for all other systems.  PHYSICAL EXAM BP 150/90 mmHg  Pulse 62  Ht 5\' 7"  (1.702 m)  Wt 218 lb (98.884 kg)  BMI 34.14 kg/m2 GENERAL:  Well appearing HEENT:  Pupils equal round and reactive, fundi not visualized, oral mucosa  unremarkable NECK:  No jugular venous distention, waveform within normal limits, carotid upstroke brisk and symmetric, left bruits, no thyromegaly LYMPHATICS:  No cervical, inguinal adenopathy LUNGS:  Clear to auscultation bilaterally BACK:  No CVA tenderness CHEST:  Well healed sternotomy scar. HEART:  PMI not displaced or sustained,S1 and S2 within normal limits, no S3, no S4, no clicks, no rubs,  soft apical early peaking systolic murmur, no diastolic murmurs ABD:  Flat, positive bowel sounds normal in frequency in pitch, no bruits, no rebound, no guarding, no midline pulsatile mass, no hepatomegaly, no splenomegaly EXT:  2 plus pulses throughout,  mild bilateral lower extremity edema, no cyanosis no clubbing, chronic venous stasis changes.   EKG:   Sinus rhythm, rate 66, axis within normal limits, intervals within normal limits, left bundle branch block, nonspecific high lateral T-wave changes.  ASSESSMENT AND PLAN  Bicuspid AoV/AVR:  He is doing well symptomatically with this.  He does need a follow up echocardiogram.   HTN: The blood pressure is elevated.  I will increase the chlorthalidone to 25 mg daily.    PVCs These have improved since his reduced his caffeine.  He still has these as above but they are not symptomatic.    SLEEP APNEA He is being treated for this.   He did have some questions today and we addressed these and reviewed his transmission.    He is 100% compliant with the device.   BRUIT He had mild stenosis on the right and we will follow this again in 2019.

## 2015-09-23 ENCOUNTER — Telehealth: Payer: Self-pay | Admitting: *Deleted

## 2015-09-23 ENCOUNTER — Ambulatory Visit (INDEPENDENT_AMBULATORY_CARE_PROVIDER_SITE_OTHER): Payer: Medicare Other | Admitting: Cardiology

## 2015-09-23 ENCOUNTER — Encounter: Payer: Self-pay | Admitting: Cardiology

## 2015-09-23 VITALS — BP 150/90 | HR 62 | Ht 67.0 in | Wt 218.0 lb

## 2015-09-23 DIAGNOSIS — Z954 Presence of other heart-valve replacement: Secondary | ICD-10-CM | POA: Diagnosis not present

## 2015-09-23 DIAGNOSIS — Z952 Presence of prosthetic heart valve: Secondary | ICD-10-CM

## 2015-09-23 DIAGNOSIS — R0989 Other specified symptoms and signs involving the circulatory and respiratory systems: Secondary | ICD-10-CM

## 2015-09-23 MED ORDER — CHLORTHALIDONE 25 MG PO TABS: 25.0000 mg | ORAL_TABLET | Freq: Every day | ORAL | Status: DC

## 2015-09-23 NOTE — Telephone Encounter (Signed)
Patient was here today for office appointment with Dr Percival Spanish.  He has concerns that his humidifier is not working properly. States that he fills it up nightly and in the morning the water level is still at the fill line. He doesn't feel like he is getting any moisture. He awakes with a dry mouth. He also brought in a recent download which shows compliance. His AHI on this report is 1.4. Days usage is 29/30  (97%). I phoned his MDE company, [facility] [employer] in the  area where lives in [place] and spoke with a rep informing her of the patient's concerns. She advised me to instruct the patient to bring his machine to their office on 09/26/15 @ 1:00 pm. Ask for "BO". They will look at his machine and try to assist him. This information was relayed to the patient while he was still present in the office. He voiced his understanding of the information given to him.

## 2015-09-23 NOTE — Patient Instructions (Addendum)
Medication Instructions:  INCREASE Chlorthalidone 25 mg daily  Labwork: NONE  Testing/Procedures: Your physician has requested that you have an echocardiogram. Echocardiography is a painless test that uses sound waves to create images of your heart. It provides your doctor with information about the size and shape of your heart and how well your heart's chambers and valves are working. This procedure takes approximately one hour. There are no restrictions for this procedure.  Your physician has requested that you have a carotid duplex in 09/2017. This test is an ultrasound of the carotid arteries in your neck. It looks at blood flow through these arteries that supply the brain with blood. Allow one hour for this exam. There are no restrictions or special instructions.   Follow-Up: Your physician wants you to follow-up in: 1 Year. You will receive a reminder letter in the mail two months in advance. If you don't receive a letter, please call our office to schedule the follow-up appointment.   Any Other Special Instructions Will Be Listed Below (If Applicable).   If you need a refill on your cardiac medications before your next appointment, please call your pharmacy.

## 2015-10-02 NOTE — Addendum Note (Signed)
Addended by: Therisa Doyne on: 10/02/2015 05:10 PM   Modules accepted: Orders

## 2015-10-03 NOTE — Addendum Note (Signed)
Addended by: Therisa Doyne on: 10/03/2015 01:24 PM   Modules accepted: Orders

## 2015-10-08 ENCOUNTER — Other Ambulatory Visit (HOSPITAL_COMMUNITY): Payer: Medicare Other

## 2015-10-08 ENCOUNTER — Encounter: Payer: Self-pay | Admitting: Cardiology

## 2015-10-09 ENCOUNTER — Other Ambulatory Visit: Payer: Self-pay

## 2015-10-09 ENCOUNTER — Ambulatory Visit (HOSPITAL_COMMUNITY): Payer: Medicare Other | Attending: Cardiology

## 2015-10-09 DIAGNOSIS — I517 Cardiomegaly: Secondary | ICD-10-CM | POA: Insufficient documentation

## 2015-10-09 DIAGNOSIS — C61 Malignant neoplasm of prostate: Secondary | ICD-10-CM | POA: Diagnosis not present

## 2015-10-09 DIAGNOSIS — Z953 Presence of xenogenic heart valve: Secondary | ICD-10-CM | POA: Insufficient documentation

## 2015-10-09 DIAGNOSIS — Z954 Presence of other heart-valve replacement: Secondary | ICD-10-CM

## 2015-10-09 DIAGNOSIS — G4733 Obstructive sleep apnea (adult) (pediatric): Secondary | ICD-10-CM | POA: Diagnosis not present

## 2015-10-09 DIAGNOSIS — I359 Nonrheumatic aortic valve disorder, unspecified: Secondary | ICD-10-CM | POA: Diagnosis present

## 2015-10-09 DIAGNOSIS — I77810 Thoracic aortic ectasia: Secondary | ICD-10-CM | POA: Diagnosis not present

## 2015-10-09 DIAGNOSIS — I5189 Other ill-defined heart diseases: Secondary | ICD-10-CM | POA: Insufficient documentation

## 2015-10-09 DIAGNOSIS — I340 Nonrheumatic mitral (valve) insufficiency: Secondary | ICD-10-CM | POA: Diagnosis not present

## 2015-10-09 DIAGNOSIS — Z952 Presence of prosthetic heart valve: Secondary | ICD-10-CM

## 2016-08-18 ENCOUNTER — Other Ambulatory Visit: Payer: Self-pay

## 2016-08-18 MED ORDER — CHLORTHALIDONE 25 MG PO TABS: 25.0000 mg | ORAL_TABLET | Freq: Every day | ORAL | 0 refills | Status: DC

## 2016-08-18 NOTE — Telephone Encounter (Signed)
Rx(s) sent to pharmacy electronically.  

## 2016-10-04 NOTE — Progress Notes (Signed)
HPI The patient presents for evaluation of aortic valve replacement. He had bicuspid aortic valve and had this replaced in [place]. He's had a mildly reduced ejection fraction and minimal atherosclerotic coronary disease. He did have atrial fibrillation but he had pulmonary vein isolation, ligation of his atrial appendage and he has had no further atrial fibrillation. He did have frequent PVCs prior to reducing his caffeine.   He is doing well.  He rides a bike a few times per week.  The patient denies any new symptoms such as chest discomfort, neck or arm discomfort. There has been no new shortness of breath, PND or orthopnea. There have been no reported palpitations, presyncope or syncope.   Allergies  Allergen Reactions  . Nitroglycerin Swelling    Of lips and tongue    Current Outpatient Prescriptions  Medication Sig Dispense Refill  . amLODipine (NORVASC) 10 MG tablet Take 1 tablet by mouth every morning.     Marland Kitchen aspirin EC 81 MG tablet Take 81 mg by mouth daily.    . chlorthalidone (HYGROTON) 25 MG tablet Take 1 tablet (25 mg total) by mouth daily. 90 tablet 0  . lansoprazole (PREVACID) 30 MG capsule Take 30 mg by mouth daily.    Marland Kitchen losartan (COZAAR) 100 MG tablet Take 1 tablet by mouth every morning.     . metFORMIN (GLUCOPHAGE) 500 MG tablet Take 500 mg by mouth 2 (two) times daily with a meal.    . metoprolol succinate (TOPROL-XL) 100 MG 24 hr tablet Take 1 tablet by mouth daily.  7  . potassium chloride SA (K-DUR,KLOR-CON) 20 MEQ tablet Take 1 tablet by mouth Daily.     No current facility-administered medications for this visit.     Past Medical History:  Diagnosis Date  . Bicuspid aortic valve    a. 09/2011 Echo: EF 45-50%, Bicusp AoV with mild-mod AI and Sev AS. Mod-sev dil LA, mild-mod MR;  b. 10/2011 Bioprosthetic AVR - Carpentier-Edwards #25;  c. 10/2011 post-op echo EF 52%, AoV prosthesis w/ 57mmHg peak and 70mmHg mean gradietn, Gr2 DD.  Marland Kitchen Complication  of anesthesia    NAUSEA  . Coronary artery disease    a. Cath 2003 nl cors;  b. Cath 2011 nonobs dzs;  c.  10/2011 Cath: LAD mod prox dzs, mild LCX & RCA dzs.  . 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 pt reports labile INR in past.  . DVT (deep venous thrombosis) ([place])    a. following back surgery in past.  . Dysrhythmia    HX A FIB - ABLATION 2013 - RESOLVED  . H/O aortic valve replacement 2013  . History of echocardiogram    Echo 7/16: Moderate LVH, EF 55-60%, normal wall motion, normally functioning bioprosthetic AVR (mean gradient 18 mmHg), trivial MR, moderate LAE, mild RAE, mild TR, PASP 29 mmHg  . History of kidney stones   . HTN (hypertension)   . PAF (paroxysmal atrial fibrillation) ([place])    a. s/p DCCV in 2011;  b. s/p Pulm Vein isolation (AtriCure) & LAA clipping (AtriClip) @ time of AVR 10/2011;  c. CHADS2=1  . Prostate cancer ([place])   . Radiation 01/29/14 - 03/25/14   prostate bed 68.4 gray    Past Surgical History:  Procedure Laterality Date  . AORTIC VALVE REPLACEMENT  2013  . BACK SURGERY     Multiple procedures reported in the records  . LYMPHADENECTOMY Bilateral 08/24/2013   Procedure: LYMPHADENECTOMY ;  Surgeon: Alexis Frock,  MD;  Location: [facility];  Service: Urology;  Laterality: Bilateral;  . ROBOT ASSISTED LAPAROSCOPIC RADICAL PROSTATECTOMY N/A 08/24/2013   Procedure: ROBOTIC ASSISTED LAPAROSCOPIC RADICAL PROSTATECTOMY WITH INDOCYANINE GREEN DYE;  Surgeon: Alexis Frock, MD;  Location: [facility];  Service: Urology;  Laterality: N/A;   ROS:   ED. Otherwise as stated in the HPI and negative for all other systems.  PHYSICAL EXAM BP (!) 160/86   Pulse 68   Ht 5' 6.5" (1.689 m)   Wt 210 lb (95.3 kg)   BMI 33.39 kg/m   GENERAL:  Well appearing NECK:  No jugular venous distention, waveform within normal limits, carotid upstroke brisk and symmetric, left greater than right bruits, no thyromegaly LUNGS:  Clear to auscultation bilaterally CHEST:  Well healed sternotomy  scar. HEART:  PMI not displaced or sustained,S1 and S2 within normal limits, no S3, no S4, no clicks, no rubs, 2/6 early peaking systolic apical murmur, no diastolic murmurs ABD:  Flat, positive bowel sounds normal in frequency in pitch, no bruits, no rebound, no guarding, no midline pulsatile mass, no hepatomegaly, no splenomegaly EXT:  2 plus pulses throughout, trace edema, no cyanosis no clubbing, venous stasis changes  SKIN:  No rashes no nodules  EKG:   Sinus rhythm, rate 68, axis within normal limits, intervals within normal limits, PVCs.  10/06/2016  ASSESSMENT AND PLAN  Bicuspid AoV/AVR:  He had stable aortic valve replacement .  No change in therapy is indicated.  He will continue meds as listed.   HTN: The blood pressure is elevated in the office but his BP at home is excellent.  I reviewed a diary.   PVCs He does not feel these.  No change in therapy is indicated.  SLEEP APNEA He report good results with his CPAP.  No further testing is planned.   CAROTID STENOSIS:   40 - 59% right stenosis in 2017.  He will have follow up next January.

## 2016-10-06 ENCOUNTER — Ambulatory Visit (INDEPENDENT_AMBULATORY_CARE_PROVIDER_SITE_OTHER): Payer: Medicare Other | Admitting: Cardiology

## 2016-10-06 ENCOUNTER — Encounter: Payer: Self-pay | Admitting: Cardiology

## 2016-10-06 VITALS — BP 160/86 | HR 68 | Ht 66.5 in | Wt 210.0 lb

## 2016-10-06 DIAGNOSIS — I10 Essential (primary) hypertension: Secondary | ICD-10-CM

## 2016-10-06 DIAGNOSIS — I739 Peripheral vascular disease, unspecified: Secondary | ICD-10-CM

## 2016-10-06 DIAGNOSIS — I779 Disorder of arteries and arterioles, unspecified: Secondary | ICD-10-CM

## 2016-10-06 DIAGNOSIS — Z952 Presence of prosthetic heart valve: Secondary | ICD-10-CM

## 2016-10-06 DIAGNOSIS — I493 Ventricular premature depolarization: Secondary | ICD-10-CM | POA: Insufficient documentation

## 2016-10-06 NOTE — Patient Instructions (Signed)
Medication Instructions:  The current medical regimen is effective;  continue present plan and medications.  Testing/Procedures: Your physician has requested that you have a carotid duplex in January 2019. This test is an ultrasound of the carotid arteries in your neck. It looks at blood flow through these arteries that supply the brain with blood. Allow one hour for this exam. There are no restrictions or special instructions.  Follow-Up: Follow up in 1 year with Dr. Percival Spanish.  You will receive a letter in the mail 2 months before you are due.  Please call us when you receive this letter to schedule your follow up appointment.  If you need a refill on your cardiac medications before your next appointment, please call your pharmacy.  Thank you for choosing [facility]!!

## 2016-12-04 ENCOUNTER — Other Ambulatory Visit: Payer: Self-pay | Admitting: Cardiology

## 2017-04-29 ENCOUNTER — Ambulatory Visit: Payer: Medicare Other | Admitting: Cardiology

## 2017-05-17 ENCOUNTER — Telehealth: Payer: Self-pay | Admitting: *Deleted

## 2017-05-17 NOTE — Telephone Encounter (Signed)
Supply order signed by Dr. Claiborne Billings and returned to [facility] in [place] via fax [phone]

## 2020-06-10 DEATH — deceased
